# Patient Record
Sex: Male | Born: 1960 | Race: White | Hispanic: No | Marital: Married | Smoking: Current some day smoker
Health system: Southern US, Community
[De-identification: ages and names within clinical notes are randomized; demographics above are authoritative.]

## PROBLEM LIST (undated history)

## (undated) DIAGNOSIS — Z720 Tobacco use: Secondary | ICD-10-CM

## (undated) DIAGNOSIS — E785 Hyperlipidemia, unspecified: Secondary | ICD-10-CM

## (undated) DIAGNOSIS — T7840XA Allergy, unspecified, initial encounter: Secondary | ICD-10-CM

## (undated) DIAGNOSIS — I639 Cerebral infarction, unspecified: Secondary | ICD-10-CM

## (undated) HISTORY — PX: WISDOM TOOTH EXTRACTION: SHX21

## (undated) HISTORY — DX: Allergy, unspecified, initial encounter: T78.40XA

## (undated) HISTORY — DX: Hyperlipidemia, unspecified: E78.5

## (undated) HISTORY — PX: COLONOSCOPY: SHX174

## (undated) HISTORY — PX: POLYPECTOMY: SHX149

## (undated) HISTORY — DX: Tobacco use: Z72.0

## (undated) HISTORY — DX: Cerebral infarction, unspecified: I63.9

---

## 2006-02-10 ENCOUNTER — Emergency Department (HOSPITAL_COMMUNITY): Admission: EM | Admit: 2006-02-10 | Discharge: 2006-02-10 | Payer: Self-pay | Admitting: Emergency Medicine

## 2006-02-16 ENCOUNTER — Ambulatory Visit: Payer: Self-pay | Admitting: Cardiovascular Disease

## 2006-02-19 ENCOUNTER — Ambulatory Visit: Payer: Self-pay | Admitting: Cardiovascular Disease

## 2006-02-19 ENCOUNTER — Ambulatory Visit: Payer: Self-pay | Admitting: Cardiology

## 2010-04-23 ENCOUNTER — Encounter (INDEPENDENT_AMBULATORY_CARE_PROVIDER_SITE_OTHER): Payer: Self-pay | Admitting: *Deleted

## 2010-04-30 NOTE — Letter (Signed)
Summary: Pre Visit Letter Revised  [facility]  [address] [place], [place] [address]   Phone: [phone]  Fax: [phone]        04/23/2010 MRN: [id number] Kyle Lozano [address] [place], [place]  [address]             Procedure Date: 06-04-10           Direct Colon---Dr. Christella Hartigan   Welcome to the Gastroenterology Division at [facility].    You are scheduled to see a nurse for your pre-procedure visit on 05-21-10 at 8:00a.m. on the 3rd floor at [facility], [address]  We ask that you try to arrive at our office 15 minutes prior to your appointment time to allow for check-in.  Please take a minute to review the attached form.  If you answer "Yes" to one or more of the questions on the first page, we ask that you call the person listed at your earliest opportunity.  If you answer "No" to all of the questions, please complete the rest of the form and bring it to your appointment.    Your nurse visit will consist of discussing your medical and surgical history, your immediate family medical history, and your medications.   If you are unable to list all of your medications on the form, please bring the medication bottles to your appointment and we will list them.  We will need to be aware of both prescribed and over the counter drugs.  We will need to know exact dosage information as well.    Please be prepared to read and sign documents such as consent forms, a financial agreement, and acknowledgement forms.  If necessary, and with your consent, a friend or relative is welcome to sit-in on the nurse visit with you.  Please bring your insurance card so that we may make a copy of it.  If your insurance requires a referral to see a specialist, please bring your referral form from your primary care physician.  No co-pay is required for this nurse visit.     If you cannot keep your appointment, please call [phone] to cancel or reschedule prior to your  appointment date.  This allows [place] the opportunity to schedule an appointment for another patient in need of care.    Thank you for choosing [facility] for your medical needs.  We appreciate the opportunity to care for you.  Please visit [place] at our website  to learn more about our practice.  Sincerely, The Gastroenterology Division

## 2010-05-21 ENCOUNTER — Ambulatory Visit (AMBULATORY_SURGERY_CENTER): Payer: 59 | Admitting: *Deleted

## 2010-05-21 VITALS — Ht 71.0 in | Wt 182.0 lb

## 2010-05-21 DIAGNOSIS — Z1211 Encounter for screening for malignant neoplasm of colon: Secondary | ICD-10-CM

## 2010-05-21 MED ORDER — PEG-KCL-NACL-NASULF-NA ASC-C 100 G PO SOLR: 1.0000 | Freq: Once | ORAL | Status: AC

## 2010-06-04 ENCOUNTER — Ambulatory Visit (AMBULATORY_SURGERY_CENTER): Payer: 59 | Admitting: Gastroenterology

## 2010-06-04 ENCOUNTER — Encounter: Payer: Self-pay | Admitting: Gastroenterology

## 2010-06-04 VITALS — BP 115/74 | HR 63 | Temp 97.6°F | Resp 20 | Ht 71.0 in | Wt 185.0 lb

## 2010-06-04 DIAGNOSIS — Z1211 Encounter for screening for malignant neoplasm of colon: Secondary | ICD-10-CM

## 2010-06-04 DIAGNOSIS — D126 Benign neoplasm of colon, unspecified: Secondary | ICD-10-CM

## 2010-06-04 MED ORDER — SODIUM CHLORIDE 0.9 % IV SOLN: 500.0000 mL | INTRAVENOUS | Status: DC

## 2010-06-04 NOTE — Patient Instructions (Signed)
See green and blue sheets for additional d/c instructions.  

## 2010-06-05 ENCOUNTER — Telehealth: Payer: Self-pay | Admitting: *Deleted

## 2010-06-05 NOTE — Telephone Encounter (Signed)
ID on machine left message to call back if having problems or has any questions

## 2015-02-28 ENCOUNTER — Encounter: Payer: Self-pay | Admitting: Physician Assistant

## 2015-02-28 ENCOUNTER — Ambulatory Visit (INDEPENDENT_AMBULATORY_CARE_PROVIDER_SITE_OTHER): Payer: 59 | Admitting: Physician Assistant

## 2015-02-28 VITALS — BP 118/78 | HR 68 | Temp 98.7°F | Resp 18 | Ht 70.25 in | Wt 183.0 lb

## 2015-02-28 DIAGNOSIS — Z0000 Encounter for general adult medical examination without abnormal findings: Secondary | ICD-10-CM | POA: Diagnosis not present

## 2015-02-28 DIAGNOSIS — Z7189 Other specified counseling: Secondary | ICD-10-CM

## 2015-02-28 DIAGNOSIS — Z23 Encounter for immunization: Secondary | ICD-10-CM | POA: Diagnosis not present

## 2015-02-28 DIAGNOSIS — Z7689 Persons encountering health services in other specified circumstances: Secondary | ICD-10-CM

## 2015-02-28 LAB — COMPLETE METABOLIC PANEL WITH GFR
ALBUMIN: 4.4 g/dL (ref 3.6–5.1)
ALT: 30 U/L (ref 9–46)
AST: 21 U/L (ref 10–35)
Alkaline Phosphatase: 57 U/L (ref 40–115)
BUN: 13 mg/dL (ref 7–25)
CALCIUM: 9 mg/dL (ref 8.6–10.3)
CHLORIDE: 104 mmol/L (ref 98–110)
CO2: 21 mmol/L (ref 20–31)
Creat: 1.02 mg/dL (ref 0.70–1.33)
GFR, EST NON AFRICAN AMERICAN: 82 mL/min (ref >=60)
Glucose, Bld: 101 mg/dL — ABNORMAL HIGH (ref 70–99)
Potassium: 4.1 mmol/L (ref 3.5–5.3)
Sodium: 138 mmol/L (ref 135–146)
Total Bilirubin: 0.8 mg/dL (ref 0.2–1.2)
Total Protein: 6.4 g/dL (ref 6.1–8.1)

## 2015-02-28 LAB — LIPID PANEL
CHOL/HDL RATIO: 3.6 ratio (ref <=5.0)
Cholesterol: 208 mg/dL — ABNORMAL HIGH (ref 125–200)
HDL: 58 mg/dL (ref >=40)
LDL CALC: 136 mg/dL — AB (ref <130)
TRIGLYCERIDES: 71 mg/dL (ref <150)
VLDL: 14 mg/dL (ref <30)

## 2015-02-28 LAB — CBC WITH DIFFERENTIAL/PLATELET
BASOS PCT: 0 % (ref 0–1)
Basophils Absolute: 0 10*3/uL (ref 0.0–0.1)
EOS PCT: 2 % (ref 0–5)
Eosinophils Absolute: 0.1 10*3/uL (ref 0.0–0.7)
HEMATOCRIT: 48.6 % (ref 39.0–52.0)
HEMOGLOBIN: 17.1 g/dL — AB (ref 13.0–17.0)
LYMPHS PCT: 23 % (ref 12–46)
Lymphs Abs: 1.5 10*3/uL (ref 0.7–4.0)
MCH: 31.3 pg (ref 26.0–34.0)
MCHC: 35.2 g/dL (ref 30.0–36.0)
MCV: 88.8 fL (ref 78.0–100.0)
MONO ABS: 0.5 10*3/uL (ref 0.1–1.0)
MONOS PCT: 8 % (ref 3–12)
MPV: 9.7 fL (ref 8.6–12.4)
NEUTROS ABS: 4.4 10*3/uL (ref 1.7–7.7)
Neutrophils Relative %: 67 % (ref 43–77)
Platelets: 172 10*3/uL (ref 150–400)
RBC: 5.47 MIL/uL (ref 4.22–5.81)
RDW: 13.8 % (ref 11.5–15.5)
WBC: 6.5 10*3/uL (ref 4.0–10.5)

## 2015-02-28 LAB — TSH: TSH: 1.372 u[IU]/mL (ref 0.350–4.500)

## 2015-02-28 NOTE — Progress Notes (Signed)
Patient ID: Kyle Lozano MRN: [id number], DOB: 03-21-1960 55 y.o. Date of Encounter: 02/28/2015, 9:54 AM    Chief Complaint: Physical (CPE)  HPI: 55 y.o. y/o white male here for CPE.   He is also being seen as a new pt to establish care.  Says it has been 2-3 years "since he went to a doctor, and at his age, thought he should have check-up."  No complaints or concerns.    Review of Systems: Consitutional: No fever, chills, fatigue, night sweats, lymphadenopathy, or weight changes. Eyes: No visual changes, eye redness, or discharge. ENT/Mouth: Ears: No otalgia, tinnitus, hearing loss, discharge. Nose: No congestion, rhinorrhea, sinus pain, or epistaxis. Throat: No sore throat, post nasal drip, or teeth pain. Cardiovascular: No CP, palpitations, diaphoresis, DOE, edema, orthopnea, PND. Respiratory: No cough, hemoptysis, SOB, or wheezing. Gastrointestinal: No anorexia, dysphagia, reflux, pain, nausea, vomiting, hematemesis, diarrhea, constipation, BRBPR, or melena. Genitourinary: No dysuria, frequency, urgency, hematuria, incontinence, nocturia, decreased urinary stream, discharge, impotence, or testicular pain/masses. Musculoskeletal: No decreased ROM, myalgias, stiffness, joint swelling, or weakness. Skin: No rash, erythema, lesion changes, pain, warmth, jaundice, or pruritis. Neurological: No headache, dizziness, syncope, seizures, tremors, memory loss, coordination problems, or paresthesias. Psychological: No anxiety, depression, hallucinations, SI/HI. Endocrine: No fatigue, polydipsia, polyphagia, polyuria, or known diabetes. All other systems were reviewed and are otherwise negative.  Past Medical History  Diagnosis Date  . Allergy     seasonal w/ migraines     History reviewed. No pertinent past surgical history.  Home Meds:  Outpatient Prescriptions Prior to Visit  Medication Sig Dispense Refill  . aspirin 81 MG tablet Take 81 mg by mouth daily.      . Multiple  Vitamins-Minerals (MULTIVITAMIN WITH MINERALS) tablet Take 1 tablet by mouth daily.      Marland Kitchen 0.9 %  sodium chloride infusion      No facility-administered medications prior to visit.    Allergies: No Known Allergies  Social History   Social History  . Marital Status: Married    Spouse Name: N/A  . Number of Children: N/A  . Years of Education: N/A   Occupational History  . Not on file.   Social History Main Topics  . Smoking status: Current Some Day Smoker    Types: Cigarettes  . Smokeless tobacco: Never Used  . Alcohol Use: 10.8 oz/week    18 Cans of beer per week  . Drug Use: No  . Sexual Activity: Not on file   Other Topics Concern  . Not on file   Social History Narrative   He works doing Occupational psychologist   Says his job involves a lot of moving around/ physical activity      Says he only smokes when he drinks on the weekends.   Only drinks on the weekend. Only drinks beer. No other liquor or wine etc    Family History  Problem Relation Age of Onset  . Heart disease Father 74    MI @ 59. No other hx    Physical Exam: Blood pressure 118/78, pulse 68, temperature 98.7 F (37.1 C), temperature source Oral, resp. rate 18, height 5' 10.25" (1.784 m), weight 183 lb (83.008 kg).  General: Well developed, well nourished, WM. Appears in no acute distress. HEENT: Normocephalic, atraumatic. Conjunctiva pink, sclera non-icteric. Pupils 2 mm constricting to 1 mm, round, regular, and equally reactive to light and accomodation. EOMI. Internal auditory canal clear. TMs with good cone of light and without pathology.  Nasal mucosa pink. Nares are without discharge. No sinus tenderness. Oral mucosa pink. Pharynx without exudate.   Neck: Supple. Trachea midline. No thyromegaly. Full ROM. No lymphadenopathy. No carotid bruits.  Lungs: Clear to auscultation bilaterally without wheezes, rales, or rhonchi. Breathing is of normal effort and unlabored. Cardiovascular: RRR with S1 S2.  No murmurs, rubs, or gallops. Distal pulses 2+ symmetrically. No carotid or abdominal bruits. Abdomen: Soft, non-tender, non-distended with normoactive bowel sounds. No hepatosplenomegaly or masses. No rebound/guarding. No CVA tenderness. No hernias. Rectal: No external hemorrhoids or fissures. Rectal vault without masses. Prostate gland firm and smooth. No nodularity, tenderness, mass, or induration.  Musculoskeletal: Full range of motion and 5/5 strength throughout. Skin: Warm and moist without erythema, ecchymosis, wounds, or rash. Neuro: A+Ox3. CN II-XII grossly intact. Moves all extremities spontaneously. Full sensation throughout. Normal gait. DTR 2+ throughout upper and lower extremities. Psych:  Responds to questions appropriately with a normal affect.   Assessment/Plan:  55 y.o. y/o  white male here for CPE  -1. Encounter to establish care with new doctor   2. Visit for preventive health examination  A. Screening Labs: - CBC with Differential/Platelet - COMPLETE METABOLIC PANEL WITH GFR - Lipid panel - TSH - VITAMIN D 25 Hydroxy (Vit-D Deficiency, Fractures)  B. Screening For Prostate Cancer: - PSA  C. Screening For Colorectal Cancer: He had Colonoscopy 05/2010. Polyps. Repeat 5 years. Pt aware due to f/u 4 2017--pt aware --he will schedule f/u.    D. Immunizations: Flu---------------------------------Give now-----------------------------Given Here--02/28/2015 Tetanus-----none in >10 years--Agreeable to receive now---Given here 02/28/2015 Pneumococcal------------------------------------------------------------No indication to get this until age 7 Zostavax-------------------------------------------------------------------Not indicated until age 29   Need for prophylactic vaccination and inoculation against influenza - Flu Vaccine QUAD 36+ mos IM  Need for Tdap vaccination - Tdap vaccine greater than or equal to 7yo IM   Signed:   [address] [place], [place]    02/28/2015 9:54 AM

## 2015-03-01 LAB — VITAMIN D 25 HYDROXY (VIT D DEFICIENCY, FRACTURES): Vit D, 25-Hydroxy: 23 ng/mL — ABNORMAL LOW (ref 30–100)

## 2015-03-01 LAB — PSA: PSA: 1.63 ng/mL (ref <=4.00)

## 2015-03-08 ENCOUNTER — Other Ambulatory Visit: Payer: Self-pay | Admitting: *Deleted

## 2015-03-08 ENCOUNTER — Encounter: Payer: Self-pay | Admitting: *Deleted

## 2015-03-08 DIAGNOSIS — E559 Vitamin D deficiency, unspecified: Secondary | ICD-10-CM

## 2015-03-08 MED ORDER — UNABLE TO FIND: Status: DC

## 2015-05-10 ENCOUNTER — Encounter: Payer: Self-pay | Admitting: Gastroenterology

## 2015-07-19 ENCOUNTER — Encounter: Payer: Self-pay | Admitting: Gastroenterology

## 2015-09-14 ENCOUNTER — Ambulatory Visit (AMBULATORY_SURGERY_CENTER): Payer: Self-pay | Admitting: *Deleted

## 2015-09-14 VITALS — Ht 71.0 in | Wt 180.0 lb

## 2015-09-14 DIAGNOSIS — Z86010 Personal history of colonic polyps: Secondary | ICD-10-CM

## 2015-09-14 MED ORDER — NA SULFATE-K SULFATE-MG SULF 17.5-3.13-1.6 GM/177ML PO SOLN: 1.0000 | Freq: Once | ORAL | 0 refills | Status: AC

## 2015-09-14 NOTE — Progress Notes (Signed)
No egg or soy allergy known to patient  No issues with past sedation with any surgeries  or procedures, no intubation problems  No diet pills per patient No home 02 use per patient  No blood thinners per patient  Pt denies issues with constipation   

## 2015-09-17 ENCOUNTER — Encounter: Payer: Self-pay | Admitting: Gastroenterology

## 2015-09-28 ENCOUNTER — Ambulatory Visit (AMBULATORY_SURGERY_CENTER): Payer: 59 | Admitting: Gastroenterology

## 2015-09-28 ENCOUNTER — Encounter: Payer: Self-pay | Admitting: Gastroenterology

## 2015-09-28 VITALS — BP 109/73 | HR 63 | Temp 98.0°F | Resp 10 | Ht 71.0 in | Wt 180.0 lb

## 2015-09-28 DIAGNOSIS — D120 Benign neoplasm of cecum: Secondary | ICD-10-CM | POA: Diagnosis not present

## 2015-09-28 DIAGNOSIS — Z86010 Personal history of colonic polyps: Secondary | ICD-10-CM

## 2015-09-28 MED ORDER — SODIUM CHLORIDE 0.9 % IV SOLN: 500.0000 mL | INTRAVENOUS | Status: DC

## 2015-09-28 NOTE — Op Note (Signed)
[facility] Patient Name: Kyle Lozano Procedure Date: 09/28/2015 8:33 AM MRN: [id number] Endoscopist: Milus Banister , MD Age: 55 Referring MD:  Date of Birth: 17-Mar-1960 Gender: Male Account #: [id number] Procedure:                Colonoscopy Indications:              High risk colon cancer surveillance: Personal                            history of colonic polyps. colonoscopy Dr. Ardis Hughs                            2012 found 2 subCM adenomas. Medicines:                Monitored Anesthesia Care Procedure:                Pre-Anesthesia Assessment:                           - Prior to the procedure, a History and Physical                            was performed, and patient medications and                            allergies were reviewed. The patient's tolerance of                            previous anesthesia was also reviewed. The risks                            and benefits of the procedure and the sedation                            options and risks were discussed with the patient.                            All questions were answered, and informed consent                            was obtained. Prior Anticoagulants: The patient has                            taken no previous anticoagulant or antiplatelet                            agents. ASA Grade Assessment: II - A patient with                            mild systemic disease. After reviewing the risks                            and benefits, the patient was deemed in  satisfactory condition to undergo the procedure.                           After obtaining informed consent, the colonoscope                            was passed under direct vision. Throughout the                            procedure, the patient's blood pressure, pulse, and                            oxygen saturations were monitored continuously. The                            Model CF-HQ190L [id number]) scope  was introduced                            through the anus and advanced to the the cecum,                            identified by appendiceal orifice and ileocecal                            valve. The colonoscopy was performed without                            difficulty. The patient tolerated the procedure                            well. The quality of the bowel preparation was                            excellent. The ileocecal valve, appendiceal                            orifice, and rectum were photographed. Scope In: 8:41:55 AM Scope Out: 8:53:23 AM Scope Withdrawal Time: 0 hours 9 minutes 12 seconds  Total Procedure Duration: 0 hours 11 minutes 28 seconds  Findings:                 Two sessile polyps were found in the cecum. The                            polyps were 2 to 5 mm in size. These polyps were                            removed with a cold snare. Resection and retrieval                            were complete.                           The exam was otherwise without abnormality on  direct and retroflexion views. Complications:            No immediate complications. Estimated blood loss:                            None. Estimated Blood Loss:     Estimated blood loss: none. Impression:               - Two 2 to 5 mm polyps in the cecum, removed with a                            cold snare. Resected and retrieved.                           - The examination was otherwise normal on direct                            and retroflexion views. Recommendation:           - Patient has a contact number available for                            emergencies. The signs and symptoms of potential                            delayed complications were discussed with the                            patient. Return to normal activities tomorrow.                            Written discharge instructions were provided to the                            patient.                            - Resume previous diet.                           - Continue present medications.                           You will receive a letter within 2-3 weeks with the                            pathology results and my final recommendations.                           If the polyp(s) is proven to be 'pre-cancerous' on                            pathology, you will need repeat colonoscopy in 5                            years. If the polyp(s) is NOT 'precancerous'  on                            pathology then you should repeat colon cancer                            screening in 10 years with colonoscopy without need                            for colon cancer screening by any method prior to                            then (including stool testing). Milus Banister, MD 09/28/2015 8:56:39 AM This report has been signed electronically.

## 2015-09-28 NOTE — Progress Notes (Signed)
To recovery, report to [place], RN, VSS,

## 2015-09-28 NOTE — Patient Instructions (Signed)
YOU HAD AN ENDOSCOPIC PROCEDURE TODAY AT [facility]:   Refer to the procedure report that was given to you for any specific questions about what was found during the examination.  If the procedure report does not answer your questions, please call your gastroenterologist to clarify.  If you requested that your care partner not be given the details of your procedure findings, then the procedure report has been included in a sealed envelope for you to review at your convenience later.  YOU SHOULD EXPECT: Some feelings of bloating in the abdomen. Passage of more gas than usual.  Walking can help get rid of the air that was put into your GI tract during the procedure and reduce the bloating. If you had a lower endoscopy (such as a colonoscopy or flexible sigmoidoscopy) you may notice spotting of blood in your stool or on the toilet paper. If you underwent a bowel prep for your procedure, you may not have a normal bowel movement for a few days.  Please Note:  You might notice some irritation and congestion in your nose or some drainage.  This is from the oxygen used during your procedure.  There is no need for concern and it should clear up in a day or so.  SYMPTOMS TO REPORT IMMEDIATELY:   Following lower endoscopy (colonoscopy or flexible sigmoidoscopy):  Excessive amounts of blood in the stool  Significant tenderness or worsening of abdominal pains  Swelling of the abdomen that is new, acute  Fever of 100F or higher    For urgent or emergent issues, a gastroenterologist can be reached at any hour by calling [phone].   DIET: Your first meal following the procedure should be a small meal and then it is ok to progress to your normal diet. Heavy or fried foods are harder to digest and may make you feel nauseous or bloated.  Likewise, meals heavy in dairy and vegetables can increase bloating.  Drink plenty of fluids but you should avoid alcoholic beverages for 24  hours.  ACTIVITY:  You should plan to take it easy for the rest of today and you should NOT DRIVE or use heavy machinery until tomorrow (because of the sedation medicines used during the test).    FOLLOW UP: Our staff will call the number listed on your records the next business day following your procedure to check on you and address any questions or concerns that you may have regarding the information given to you following your procedure. If we do not reach you, we will leave a message.  However, if you are feeling well and you are not experiencing any problems, there is no need to return our call.  We will assume that you have returned to your regular daily activities without incident.  If any biopsies were taken you will be contacted by phone or by letter within the next 1-3 weeks.  Please call us at [phone] if you have not heard about the biopsies in 3 weeks.    SIGNATURES/CONFIDENTIALITY: You and/or your care partner have signed paperwork which will be entered into your electronic medical record.  These signatures attest to the fact that that the information above on your After Visit Summary has been reviewed and is understood.  Full responsibility of the confidentiality of this discharge information lies with you and/or your care-partner.   INFORMATION ON POLYPS GIVEN TO YOU TODAY  AWAIT PATHOLOGY RESULTS

## 2015-09-28 NOTE — Progress Notes (Signed)
Called to room to assist during endoscopic procedure.  Patient ID and intended procedure confirmed with present staff. Received instructions for my participation in the procedure from the performing physician.  

## 2015-10-01 ENCOUNTER — Telehealth: Payer: Self-pay

## 2015-10-01 NOTE — Telephone Encounter (Signed)
  Follow up Call-  Call back number 09/28/2015  Post procedure Call Back phone  # [phone]  Permission to leave phone message Yes  Some recent data might be hidden     Patient questions:  Do you have a fever, pain , or abdominal swelling? No. Pain Score  0 *  Have you tolerated food without any problems? Yes.    Have you been able to return to your normal activities? Yes.    Do you have any questions about your discharge instructions: Diet   No. Medications  No. Follow up visit  No.  Do you have questions or concerns about your Care? No.  Actions: * If pain score is 4 or above: No action needed, pain <4.

## 2015-10-05 ENCOUNTER — Encounter: Payer: Self-pay | Admitting: Gastroenterology

## 2016-04-25 DIAGNOSIS — L308 Other specified dermatitis: Secondary | ICD-10-CM | POA: Diagnosis not present

## 2016-05-16 DIAGNOSIS — T1500XA Foreign body in cornea, unspecified eye, initial encounter: Secondary | ICD-10-CM | POA: Diagnosis not present

## 2016-05-22 ENCOUNTER — Encounter: Payer: Self-pay | Admitting: Physician Assistant

## 2016-05-22 ENCOUNTER — Ambulatory Visit (INDEPENDENT_AMBULATORY_CARE_PROVIDER_SITE_OTHER): Payer: 59 | Admitting: Physician Assistant

## 2016-05-22 VITALS — BP 108/72 | HR 71 | Temp 98.2°F | Resp 18 | Wt 181.2 lb

## 2016-05-22 DIAGNOSIS — Z0000 Encounter for general adult medical examination without abnormal findings: Secondary | ICD-10-CM

## 2016-05-22 DIAGNOSIS — E559 Vitamin D deficiency, unspecified: Secondary | ICD-10-CM

## 2016-05-22 LAB — CBC WITH DIFFERENTIAL/PLATELET
BASOS ABS: 0 {cells}/uL (ref 0–200)
Basophils Relative: 0 %
EOS PCT: 2 %
Eosinophils Absolute: 98 cells/uL (ref 15–500)
HCT: 49.7 % (ref 38.5–50.0)
Hemoglobin: 16.6 g/dL (ref 13.0–17.0)
LYMPHS PCT: 30 %
Lymphs Abs: 1470 cells/uL (ref 850–3900)
MCH: 30.9 pg (ref 27.0–33.0)
MCHC: 33.4 g/dL (ref 32.0–36.0)
MCV: 92.6 fL (ref 80.0–100.0)
MPV: 9.6 fL (ref 7.5–12.5)
Monocytes Absolute: 441 cells/uL (ref 200–950)
Monocytes Relative: 9 %
NEUTROS PCT: 59 %
Neutro Abs: 2891 cells/uL (ref 1500–7800)
Platelets: 172 10*3/uL (ref 140–400)
RBC: 5.37 MIL/uL (ref 4.20–5.80)
RDW: 13.9 % (ref 11.0–15.0)
WBC: 4.9 10*3/uL (ref 3.8–10.8)

## 2016-05-22 LAB — LIPID PANEL
CHOL/HDL RATIO: 3.2 ratio (ref <5.0)
CHOLESTEROL: 203 mg/dL — AB (ref <200)
HDL: 63 mg/dL (ref >40)
LDL Cholesterol: 130 mg/dL — ABNORMAL HIGH (ref <100)
Triglycerides: 51 mg/dL (ref <150)
VLDL: 10 mg/dL (ref <30)

## 2016-05-22 LAB — COMPLETE METABOLIC PANEL WITH GFR
ALT: 34 U/L (ref 9–46)
AST: 24 U/L (ref 10–35)
Albumin: 4.5 g/dL (ref 3.6–5.1)
Alkaline Phosphatase: 68 U/L (ref 40–115)
BUN: 14 mg/dL (ref 7–25)
CALCIUM: 9 mg/dL (ref 8.6–10.3)
CHLORIDE: 108 mmol/L (ref 98–110)
CO2: 21 mmol/L (ref 20–31)
Creat: 1.03 mg/dL (ref 0.70–1.33)
GFR, EST NON AFRICAN AMERICAN: 81 mL/min (ref >=60)
GFR, Est African American: >89 mL/min (ref >=60)
GLUCOSE: 105 mg/dL — AB (ref 70–99)
POTASSIUM: 4.1 mmol/L (ref 3.5–5.3)
SODIUM: 142 mmol/L (ref 135–146)
Total Bilirubin: 0.7 mg/dL (ref 0.2–1.2)
Total Protein: 6.6 g/dL (ref 6.1–8.1)

## 2016-05-22 LAB — TSH: TSH: 0.92 mIU/L (ref 0.40–4.50)

## 2016-05-22 LAB — PSA: PSA: 1.6 ng/mL (ref <=4.0)

## 2016-05-22 NOTE — Progress Notes (Signed)
Patient ID: Kyle Lozano MRN: [id number], DOB: 1960/08/27 56 y.o. Date of Encounter: 05/22/2016, 8:58 AM    Chief Complaint: Physical (CPE)  HPI: 56 y.o. y/o white male here for CPE.   02/28/2015: He is also being seen as a new pt to establish care.  Says it has been 2-3 years "since he went to a doctor, and at his age, thought he should have check-up." No complaints or concerns.    05/22/2016: Presetns for annual physical. Has no complaints or concerns to address. He has been feeling well. Has had no medical problems over the past year and no updates today.  He does exercise routinely using treadmill at home but is getting ready to join a gym.  Review of Systems: Consitutional: No fever, chills, fatigue, night sweats, lymphadenopathy, or weight changes. Eyes: No visual changes, eye redness, or discharge. ENT/Mouth: Ears: No otalgia, tinnitus, hearing loss, discharge. Nose: No congestion, rhinorrhea, sinus pain, or epistaxis. Throat: No sore throat, post nasal drip, or teeth pain. Cardiovascular: No CP, palpitations, diaphoresis, DOE, edema, orthopnea, PND. Respiratory: No cough, hemoptysis, SOB, or wheezing. Gastrointestinal: No anorexia, dysphagia, reflux, pain, nausea, vomiting, hematemesis, diarrhea, constipation, BRBPR, or melena. Genitourinary: No dysuria, frequency, urgency, hematuria, incontinence, nocturia, decreased urinary stream, discharge, impotence, or testicular pain/masses. Musculoskeletal: No decreased ROM, myalgias, stiffness, joint swelling, or weakness. Skin: No rash, erythema, lesion changes, pain, warmth, jaundice, or pruritis. Neurological: No headache, dizziness, syncope, seizures, tremors, memory loss, coordination problems, or paresthesias. Psychological: No anxiety, depression, hallucinations, SI/HI. Endocrine: No fatigue, polydipsia, polyphagia, polyuria, or known diabetes. All other systems were reviewed and are otherwise negative.  Past Medical  History:  Diagnosis Date  . Allergy    seasonal w/ migraines     Past Surgical History:  Procedure Laterality Date  . COLONOSCOPY    . POLYPECTOMY    . WISDOM TOOTH EXTRACTION      Home Meds:  Outpatient Medications Prior to Visit  Medication Sig Dispense Refill  . aspirin 81 MG tablet Take 81 mg by mouth daily.      . Omega-3 Fatty Acids (FISH OIL) 1000 MG CAPS Take 1 capsule by mouth daily.    Marland Kitchen UNABLE TO FIND Med Name: VITAMIN D OTC 400 Units 0   Facility-Administered Medications Prior to Visit  Medication Dose Route Frequency Provider Last Rate Last Dose  . 0.9 %  sodium chloride infusion  500 mL Intravenous Continuous Milus Banister, MD        Allergies: No Known Allergies  Social History   Social History  . Marital status: Married    Spouse name: N/A  . Number of children: N/A  . Years of education: N/A   Occupational History  . Not on file.   Social History Main Topics  . Smoking status: Current Some Day Smoker    Packs/day: 0.25    Types: Cigarettes  . Smokeless tobacco: Never Used  . Alcohol use 10.8 oz/week    18 Cans of beer per week  . Drug use: No  . Sexual activity: Not on file   Other Topics Concern  . Not on file   Social History Narrative   He works doing Occupational psychologist   Says his job involves a lot of moving around/ physical activity      Says he only smokes when he drinks on the weekends.   Only drinks on the weekend. Only drinks beer. No other liquor or wine etc  Family History  Problem Relation Age of Onset  . Heart disease Father 80    MI @ 11. No other hx  . Colon cancer Neg Hx   . Colon polyps Neg Hx   . Esophageal cancer Neg Hx   . Rectal cancer Neg Hx   . Stomach cancer Neg Hx     Physical Exam: Blood pressure 108/72, pulse 71, temperature 98.2 F (36.8 C), temperature source Oral, resp. rate 18, weight 181 lb 3.2 oz (82.2 kg), SpO2 98 %.  General: Well developed, well nourished, WM. Appears in no acute  distress. HEENT: Normocephalic, atraumatic. Conjunctiva pink, sclera non-icteric. Pupils 2 mm constricting to 1 mm, round, regular, and equally reactive to light and accomodation. EOMI. Internal auditory canal clear. TMs with good cone of light and without pathology. Nasal mucosa pink. Nares are without discharge. No sinus tenderness. Oral mucosa pink. Pharynx without exudate.   Neck: Supple. Trachea midline. No thyromegaly. Full ROM. No lymphadenopathy. No carotid bruits.  Lungs: Clear to auscultation bilaterally without wheezes, rales, or rhonchi. Breathing is of normal effort and unlabored. Cardiovascular: RRR with S1 S2. No murmurs, rubs, or gallops. Distal pulses 2+ symmetrically. No carotid or abdominal bruits. Abdomen: Soft, non-tender, non-distended with normoactive bowel sounds. No hepatosplenomegaly or masses. No rebound/guarding. No CVA tenderness. No hernias. Rectal: No external hemorrhoids or fissures. Rectal vault without masses. Prostate gland firm and smooth. No nodularity, tenderness, mass, or induration.  Musculoskeletal: Full range of motion and 5/5 strength throughout. Skin: Warm and moist without erythema, ecchymosis, wounds, or rash. Neuro: A+Ox3. CN II-XII grossly intact. Moves all extremities spontaneously. Full sensation throughout. Normal gait. DTR 2+ throughout upper and lower extremities. Psych:  Responds to questions appropriately with a normal affect.   Assessment/Plan:  56 y.o. y/o  white male here for CPE  Visit for preventive health examination  A. Screening Labs: 05/22/2016--He is fasting--Check the following labs now - CBC with Differential/Platelet - COMPLETE METABOLIC PANEL WITH GFR - Lipid panel - TSH - VITAMIN D 25 Hydroxy (Vit-D Deficiency, Fractures)  B. Screening For Prostate Cancer: 05/22/2016: - PSA  C. Screening For Colorectal Cancer: 05/22/2016: He had Colonoscopy 05/2010. Polyps. Repeat 5 years. 05/22/2016:He had f/u Colonoscopy 09/28/2015---in  [facility]--Dr. Ardis Hughs   D. Immunizations: Flu--------------------------------------------------------------------------Given Here--02/28/2015--------N/A today----05/22/2016 Tetanus--------------------------------------------------------------------Given here 02/28/2015--------UpToDate----05/22/2016 Pneumococcal------------------------------------------------------------No indication to get this until age 43-------05/22/2016 Zostavax-------------------------------------------------------------------Not indicated until age 32-----We are going to start getting Shingrix but do not have this yet---will discuss at next OV---05/22/2016   Signed:   [address] [place], [place]  05/22/2016 8:58 AM

## 2016-05-23 LAB — VITAMIN D 25 HYDROXY (VIT D DEFICIENCY, FRACTURES): Vit D, 25-Hydroxy: 47 ng/mL (ref 30–100)

## 2017-01-30 DIAGNOSIS — Z23 Encounter for immunization: Secondary | ICD-10-CM | POA: Diagnosis not present

## 2017-01-30 DIAGNOSIS — L308 Other specified dermatitis: Secondary | ICD-10-CM | POA: Diagnosis not present

## 2017-05-28 ENCOUNTER — Other Ambulatory Visit: Payer: Self-pay

## 2017-05-28 ENCOUNTER — Encounter: Payer: Self-pay | Admitting: Physician Assistant

## 2017-05-28 ENCOUNTER — Ambulatory Visit (INDEPENDENT_AMBULATORY_CARE_PROVIDER_SITE_OTHER): Payer: 59 | Admitting: Physician Assistant

## 2017-05-28 VITALS — BP 126/74 | HR 93 | Temp 98.1°F | Resp 14 | Ht 70.5 in | Wt 175.4 lb

## 2017-05-28 DIAGNOSIS — E559 Vitamin D deficiency, unspecified: Secondary | ICD-10-CM

## 2017-05-28 DIAGNOSIS — N529 Male erectile dysfunction, unspecified: Secondary | ICD-10-CM | POA: Diagnosis not present

## 2017-05-28 DIAGNOSIS — Z0000 Encounter for general adult medical examination without abnormal findings: Secondary | ICD-10-CM | POA: Diagnosis not present

## 2017-05-28 DIAGNOSIS — R6882 Decreased libido: Secondary | ICD-10-CM | POA: Diagnosis not present

## 2017-05-28 MED ORDER — SILDENAFIL CITRATE 100 MG PO TABS: 50.0000 mg | ORAL_TABLET | Freq: Every day | ORAL | 11 refills | Status: DC | PRN
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
## 2017-05-29 ENCOUNTER — Other Ambulatory Visit: Payer: 59

## 2017-06-03 ENCOUNTER — Other Ambulatory Visit: Payer: 59

## 2017-06-03 DIAGNOSIS — E559 Vitamin D deficiency, unspecified: Secondary | ICD-10-CM

## 2017-06-03 DIAGNOSIS — Z0000 Encounter for general adult medical examination without abnormal findings: Secondary | ICD-10-CM

## 2017-06-03 DIAGNOSIS — N529 Male erectile dysfunction, unspecified: Secondary | ICD-10-CM

## 2017-06-03 DIAGNOSIS — R6882 Decreased libido: Secondary | ICD-10-CM

## 2017-06-03 DIAGNOSIS — R739 Hyperglycemia, unspecified: Secondary | ICD-10-CM | POA: Diagnosis not present

## 2017-06-09 LAB — CBC WITH DIFFERENTIAL/PLATELET
BASOS PCT: 0.7 %
Basophils Absolute: 43 cells/uL (ref 0–200)
EOS ABS: 124 {cells}/uL (ref 15–500)
Eosinophils Relative: 2 %
HCT: 49.2 % (ref 38.5–50.0)
Hemoglobin: 17.3 g/dL — ABNORMAL HIGH (ref 13.2–17.1)
LYMPHS ABS: 2207 {cells}/uL (ref 850–3900)
MCH: 31.6 pg (ref 27.0–33.0)
MCHC: 35.2 g/dL (ref 32.0–36.0)
MCV: 89.9 fL (ref 80.0–100.0)
MPV: 9.9 fL (ref 7.5–12.5)
Monocytes Relative: 9.3 %
NEUTROS PCT: 52.4 %
Neutro Abs: 3249 cells/uL (ref 1500–7800)
Platelets: 200 10*3/uL (ref 140–400)
RBC: 5.47 10*6/uL (ref 4.20–5.80)
RDW: 12.8 % (ref 11.0–15.0)
TOTAL LYMPHOCYTE: 35.6 %
WBC: 6.2 10*3/uL (ref 3.8–10.8)
WBCMIX: 577 {cells}/uL (ref 200–950)

## 2017-06-09 LAB — COMPLETE METABOLIC PANEL WITH GFR
AG Ratio: 2.4 (calc) (ref 1.0–2.5)
ALBUMIN MSPROF: 4.5 g/dL (ref 3.6–5.1)
ALT: 22 U/L (ref 9–46)
AST: 21 U/L (ref 10–35)
Alkaline phosphatase (APISO): 77 U/L (ref 40–115)
BUN: 14 mg/dL (ref 7–25)
CALCIUM: 9 mg/dL (ref 8.6–10.3)
CHLORIDE: 107 mmol/L (ref 98–110)
CO2: 24 mmol/L (ref 20–32)
CREATININE: 1.05 mg/dL (ref 0.70–1.33)
GFR, EST AFRICAN AMERICAN: 91 mL/min/{1.73_m2} (ref >=60)
GFR, EST NON AFRICAN AMERICAN: 78 mL/min/{1.73_m2} (ref >=60)
GLUCOSE: 133 mg/dL — AB (ref 65–99)
Globulin: 1.9 g/dL (calc) (ref 1.9–3.7)
Potassium: 4.2 mmol/L (ref 3.5–5.3)
Sodium: 142 mmol/L (ref 135–146)
TOTAL PROTEIN: 6.4 g/dL (ref 6.1–8.1)
Total Bilirubin: 0.7 mg/dL (ref 0.2–1.2)

## 2017-06-09 LAB — TESTOSTERONE: Testosterone: 439 ng/dL (ref 250–827)

## 2017-06-09 LAB — LIPID PANEL
Cholesterol: 196 mg/dL (ref <200)
HDL: 74 mg/dL (ref >40)
LDL Cholesterol (Calc): 108 mg/dL — ABNORMAL HIGH
Non-HDL Cholesterol (Calc): 122 mg/dL (ref <130)
Total CHOL/HDL Ratio: 2.6 (calc) (ref <5.0)
Triglycerides: 49 mg/dL (ref <150)

## 2017-06-09 LAB — HEMOGLOBIN A1C: Hgb A1c MFr Bld: 5.6 %{Hb} (ref <5.7)

## 2017-06-09 LAB — TSH: TSH: 1.31 mIU/L (ref 0.40–4.50)

## 2017-06-09 LAB — PSA: PSA: 2.2 ng/mL (ref <=4.0)

## 2017-06-09 LAB — VITAMIN D 25 HYDROXY (VIT D DEFICIENCY, FRACTURES): Vit D, 25-Hydroxy: 30 ng/mL (ref 30–100)

## 2017-10-29 ENCOUNTER — Ambulatory Visit: Payer: 59 | Admitting: Physician Assistant

## 2017-10-29 ENCOUNTER — Encounter: Payer: Self-pay | Admitting: Physician Assistant

## 2017-10-29 VITALS — BP 108/64 | HR 76 | Temp 98.9°F | Resp 16 | Ht 71.0 in | Wt 176.6 lb

## 2017-10-29 DIAGNOSIS — S76312A Strain of muscle, fascia and tendon of the posterior muscle group at thigh level, left thigh, initial encounter: Secondary | ICD-10-CM

## 2017-10-29 NOTE — Progress Notes (Signed)
Patient ID: Kyle Lozano MRN: [id number], DOB: 09-22-1960, 57 y.o. Date of Encounter: 10/29/2017, 2:39 PM    Chief Complaint:  Chief Complaint  Patient presents with  . Knee Pain     HPI: 57 y.o. year old male presents with above.   Reports that he has been working a job recently that requires a lot of squatting down, then standing up, then squatting down again.  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  Reports that he has been working this job for about a month. He has had some gradual onset of this discomfort in the posterior aspect of the left leg-- just above the knee.  Says that it actually is a lot better today.  Says that "last week it was killing me."  Says that  "it was at its worst 2 days ago."      Home Meds:   Outpatient Medications Prior to Visit  Medication Sig Dispense Refill  . aspirin 81 MG tablet Take 81 mg by mouth daily.      . clobetasol cream (TEMOVATE) 0.30 % Apply 1 application topically daily as needed.    . Omega-3 Fatty Acids (FISH OIL) 1000 MG CAPS Take 1 capsule by mouth daily.    . sildenafil (VIAGRA) 100 MG tablet Take 0.5-1 tablets (50-100 mg total) by mouth daily as needed for erectile dysfunction. 3 tablet 11  . tobramycin (TOBREX) 0.3 % ophthalmic solution Place 1 drop into the left eye daily.    Marland Kitchen UNABLE TO FIND Med Name: VITAMIN D OTC 400 Units 0   Facility-Administered Medications Prior to Visit  Medication Dose Route Frequency Provider Last Rate Last Dose  . 0.9 %  sodium chloride infusion  500 mL Intravenous Continuous Milus Banister, MD        Allergies: No Known Allergies    Review of Systems: See HPI for pertinent ROS. All other ROS negative.    Physical Exam: Blood pressure 108/64, pulse 76, temperature 98.9 F (37.2 C), temperature source Oral, resp. rate 16, height 5\' 11"  (1.803 m), weight 80.1 kg, SpO2 96 %., Body mass index is 24.63 kg/m. General:  WNWD WF. Appears in no  acute distress. Neck: Supple. No thyromegaly. No lymphadenopathy. Lungs: Clear bilaterally to auscultation without wheezes, rales, or rhonchi. Breathing is unlabored. Heart: Regular rhythm. No murmurs, rubs, or gallops. Msk:  Strength and tone normal for age. Left Knee: Inspection, Physical exam of the knee is normal.  There is tenderness with palpation of posterior aspect of left leg, just superior to level of knee. There is area of tenderness with palpation.  Had him do a hamstring stretch, straight leg raise---this reproduces his area of pain, symptoms.  Extremities/Skin: Warm and dry.  Neuro: Alert and oriented X 3. Moves all extremities spontaneously. Gait is normal. CNII-XII grossly in tact. Psych:  Responds to questions appropriately with a normal affect.     ASSESSMENT AND PLAN:  57 y.o. year old male with   1. Strain of left hamstring, initial encounter Feel that his symptoms are secondary to hamstring strain.  Discussed resting his muscle.  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  Not require any type of formal work restrictions or work note.  Recommended that he rest these  muscles.  Stop the squatting and standing repetitive motions.  Told him to do this same hamstring stretch at home at least once a day.  If symptoms worsen call and will refer to orthopedics for further evaluation.   [address] [place], [place], [facility] 10/29/2017 2:39 PM

## 2017-11-11 DIAGNOSIS — M2392 Unspecified internal derangement of left knee: Secondary | ICD-10-CM | POA: Diagnosis not present

## 2017-11-11 DIAGNOSIS — M25562 Pain in left knee: Secondary | ICD-10-CM | POA: Insufficient documentation

## 2018-11-02 ENCOUNTER — Other Ambulatory Visit: Payer: Self-pay | Admitting: Registered"

## 2018-11-02 DIAGNOSIS — Z20822 Contact with and (suspected) exposure to covid-19: Secondary | ICD-10-CM

## 2018-11-04 LAB — NOVEL CORONAVIRUS, NAA: SARS-CoV-2, NAA: NOT DETECTED

## 2018-12-09 ENCOUNTER — Other Ambulatory Visit: Payer: Self-pay

## 2018-12-09 ENCOUNTER — Encounter: Payer: Self-pay | Admitting: Family Medicine

## 2018-12-09 ENCOUNTER — Ambulatory Visit (INDEPENDENT_AMBULATORY_CARE_PROVIDER_SITE_OTHER): Payer: 59 | Admitting: Family Medicine

## 2018-12-09 VITALS — BP 110/74 | HR 90 | Temp 97.8°F | Resp 14 | Ht 71.0 in | Wt 174.0 lb

## 2018-12-09 DIAGNOSIS — Z125 Encounter for screening for malignant neoplasm of prostate: Secondary | ICD-10-CM

## 2018-12-09 DIAGNOSIS — Z1159 Encounter for screening for other viral diseases: Secondary | ICD-10-CM | POA: Diagnosis not present

## 2018-12-09 DIAGNOSIS — Z720 Tobacco use: Secondary | ICD-10-CM | POA: Diagnosis not present

## 2018-12-09 DIAGNOSIS — Z0000 Encounter for general adult medical examination without abnormal findings: Secondary | ICD-10-CM

## 2018-12-09 DIAGNOSIS — Z23 Encounter for immunization: Secondary | ICD-10-CM | POA: Diagnosis not present

## 2018-12-09 NOTE — Addendum Note (Signed)
Addended by: Shary Decamp B on: 12/09/2018 11:05 AM   Modules accepted: Orders

## 2018-12-09 NOTE — Progress Notes (Signed)
Subjective:    Patient ID: Kyle Lozano, male    DOB: [birth year], 58 y.o.   MRN: [id number]  HPI Patient is a very pleasant 59 year old Caucasian male here today for complete physical exam.  His last colonoscopy was in 2017.  He is due for repeat colonoscopy in 2022.Marland Kitchen  He is due for prostate cancer screening with a PSA.  Patient smokes however he does not smoke a pack a day.  He does not have 30-pack-year history of smoking.  Therefore he does not qualify for lung cancer screening.  Strongly recommended smoking cessation however the patient is in the precontemplative phase.  However due to his smoking, he is due for Pneumovax 23.  He also request a flu shot.  Given his age, he is also recommended to have hepatitis C screening.  Otherwise his review of systems is negative.  He denies any concerns.  He is tolerating Viagra without any complications. Past Medical History:  Diagnosis Date  . Allergy    seasonal w/ migraines   Past Surgical History:  Procedure Laterality Date  . COLONOSCOPY    . POLYPECTOMY    . WISDOM TOOTH EXTRACTION     Current Outpatient Medications on File Prior to Visit  Medication Sig Dispense Refill  . aspirin 81 MG tablet Take 81 mg by mouth daily.      . clobetasol cream (TEMOVATE) [id number] % Apply 1 application topically daily as needed.    . Omega-3 Fatty Acids (FISH OIL) 1000 MG CAPS Take 1 capsule by mouth daily.    . sildenafil (VIAGRA) 100 MG tablet Take 0.5-1 tablets (50-100 mg total) by mouth daily as needed for erectile dysfunction. 3 tablet 11  . UNABLE TO FIND Med Name: VITAMIN D OTC 400 Units 0   Current Facility-Administered Medications on File Prior to Visit  Medication Dose Route Frequency Provider Last Rate Last Dose  . 0.9 %  sodium chloride infusion  500 mL Intravenous Continuous Milus Banister, MD       No Known Allergies Social History   Socioeconomic History  . Marital status: Married    Spouse name: Not on file  . Number of children:  Not on file  . Years of education: Not on file  . Highest education level: Not on file  Occupational History  . Not on file  Social Needs  . Financial resource strain: Not on file  . Food insecurity    Worry: Not on file    Inability: Not on file  . Transportation needs    Medical: Not on file    Non-medical: Not on file  Tobacco Use  . Smoking status: Current Some Day Smoker    Packs/day: 0.25    Types: Cigarettes  . Smokeless tobacco: Never Used  Substance and Sexual Activity  . Alcohol use: Yes    Alcohol/week: 18.0 standard drinks    Types: 18 Cans of beer per week  . Drug use: No  . Sexual activity: Not on file  Lifestyle  . Physical activity    Days per week: Not on file    Minutes per session: Not on file  . Stress: Not on file  Relationships  . Social Herbalist on phone: Not on file    Gets together: Not on file    Attends religious service: Not on file    Active member of club or organization: Not on file    Attends meetings of clubs or organizations: Not  on file    Relationship status: Not on file  . Intimate partner violence    Fear of current or ex partner: Not on file    Emotionally abused: Not on file    Physically abused: Not on file    Forced sexual activity: Not on file  Other Topics Concern  . Not on file  Social History Narrative   He works doing Occupational psychologist   Says his job involves a lot of moving around/ physical activity      Says he only smokes when he drinks on the weekends.   Only drinks on the weekend. Only drinks beer. No other liquor or wine etc   Family History  Problem Relation Age of Onset  . Heart disease Father 27       MI @ 55. No other hx  . Colon cancer Neg Hx   . Colon polyps Neg Hx   . Esophageal cancer Neg Hx   . Rectal cancer Neg Hx   . Stomach cancer Neg Hx       Review of Systems  All other systems reviewed and are negative.      Objective:   Physical Exam Vitals signs reviewed.   Constitutional:      General: He is not in acute distress.    Appearance: Normal appearance. He is normal weight. He is not ill-appearing, toxic-appearing or diaphoretic.  HENT:     Head: Normocephalic and atraumatic.     Right Ear: Tympanic membrane and ear canal normal. There is no impacted cerumen.     Left Ear: Tympanic membrane and ear canal normal. There is no impacted cerumen.     Nose: Nose normal. No congestion or rhinorrhea.     Mouth/Throat:     Mouth: Mucous membranes are moist.     Pharynx: Oropharynx is clear. No oropharyngeal exudate or posterior oropharyngeal erythema.  Eyes:     General: No scleral icterus.       Right eye: No discharge.        Left eye: No discharge.     Extraocular Movements: Extraocular movements intact.     Conjunctiva/sclera: Conjunctivae normal.     Pupils: Pupils are equal, round, and reactive to light.  Neck:     Musculoskeletal: Neck supple. No neck rigidity or muscular tenderness.     Vascular: No carotid bruit.  Cardiovascular:     Rate and Rhythm: Normal rate and regular rhythm.     Pulses: Normal pulses.     Heart sounds: Normal heart sounds. No murmur. No friction rub. No gallop.   Pulmonary:     Effort: Pulmonary effort is normal. No respiratory distress.     Breath sounds: Normal breath sounds. No stridor. No wheezing, rhonchi or rales.  Chest:     Chest wall: No tenderness.  Abdominal:     General: Abdomen is flat. Bowel sounds are normal. There is no distension.     Palpations: Abdomen is soft. There is no mass.     Tenderness: There is no abdominal tenderness. There is no right CVA tenderness, left CVA tenderness, guarding or rebound.     Hernia: No hernia is present.  Musculoskeletal: Normal range of motion.        General: No swelling, tenderness, deformity or signs of injury.     Right lower leg: No edema.     Left lower leg: No edema.  Lymphadenopathy:     Cervical: No cervical adenopathy.  Skin:  General: Skin is  warm.     Capillary Refill: Capillary refill takes less than 2 seconds.     Coloration: Skin is not jaundiced or pale.     Findings: No bruising, erythema, lesion or rash.  Neurological:     General: No focal deficit present.     Mental Status: He is alert and oriented to person, place, and time. Mental status is at baseline.     Cranial Nerves: No cranial nerve deficit.     Sensory: No sensory deficit.     Motor: No weakness.     Coordination: Coordination normal.     Gait: Gait normal.     Deep Tendon Reflexes: Reflexes normal.           Assessment & Plan:  General medical exam - Plan: CBC with Differential/Platelet, COMPLETE METABOLIC PANEL WITH GFR, Lipid panel  Tobacco abuse - Plan: CBC with Differential/Platelet, COMPLETE METABOLIC PANEL WITH GFR, Lipid panel  Prostate cancer screening - Plan: PSA  Encounter for hepatitis C screening test for low risk patient - Plan: Hepatitis C Antibody  Physical exam today is normal.  Blood pressure is excellent.  Colon cancer screening is up-to-date.  I will have the patient return fasting for lab work and at that time I will check a PSA to screen for prostate cancer.  I will also screen the patient for hepatitis C.  Recommended smoking cessation.  However the patient received his flu shot today and due to his smoking also received Pneumovax 23.  Also return fasting for CBC, CMP, and fasting lipid panel.  I will calculate the patient's 10-year history of cardiovascular disease and if greater than 7.5% would recommend a statin.  Regular anticipatory guidance is provided.

## 2018-12-13 ENCOUNTER — Other Ambulatory Visit: Payer: Self-pay

## 2018-12-13 ENCOUNTER — Other Ambulatory Visit: Payer: 59

## 2018-12-14 ENCOUNTER — Encounter: Payer: Self-pay | Admitting: Family Medicine

## 2018-12-14 DIAGNOSIS — E785 Hyperlipidemia, unspecified: Secondary | ICD-10-CM | POA: Insufficient documentation

## 2018-12-16 ENCOUNTER — Other Ambulatory Visit: Payer: Self-pay

## 2018-12-16 DIAGNOSIS — R6882 Decreased libido: Secondary | ICD-10-CM

## 2018-12-16 DIAGNOSIS — N529 Male erectile dysfunction, unspecified: Secondary | ICD-10-CM

## 2018-12-16 LAB — LIPID PANEL
Cholesterol: 225 mg/dL — ABNORMAL HIGH (ref <200)
HDL: 83 mg/dL (ref >=40)
LDL Cholesterol (Calc): 127 mg/dL (calc) — ABNORMAL HIGH
Non-HDL Cholesterol (Calc): 142 mg/dL (calc) — ABNORMAL HIGH (ref <130)
Total CHOL/HDL Ratio: 2.7 (calc) (ref <5.0)
Triglycerides: 49 mg/dL (ref <150)

## 2018-12-16 LAB — CBC WITH DIFFERENTIAL/PLATELET
Absolute Monocytes: 602 cells/uL (ref 200–950)
Basophils Absolute: 41 cells/uL (ref 0–200)
Basophils Relative: 0.7 %
Eosinophils Absolute: 142 cells/uL (ref 15–500)
Eosinophils Relative: 2.4 %
HCT: 48.8 % (ref 38.5–50.0)
Hemoglobin: 16.6 g/dL (ref 13.2–17.1)
Lymphs Abs: 1947 cells/uL (ref 850–3900)
MCH: 31.8 pg (ref 27.0–33.0)
MCHC: 34 g/dL (ref 32.0–36.0)
MCV: 93.5 fL (ref 80.0–100.0)
MPV: 9.9 fL (ref 7.5–12.5)
Monocytes Relative: 10.2 %
Neutro Abs: 3168 cells/uL (ref 1500–7800)
Neutrophils Relative %: 53.7 %
Platelets: 192 10*3/uL (ref 140–400)
RBC: 5.22 10*6/uL (ref 4.20–5.80)
RDW: 12.8 % (ref 11.0–15.0)
Total Lymphocyte: 33 %
WBC: 5.9 10*3/uL (ref 3.8–10.8)

## 2018-12-16 LAB — COMPLETE METABOLIC PANEL WITH GFR
AG Ratio: 2.4 (calc) (ref 1.0–2.5)
ALT: 31 U/L (ref 9–46)
AST: 22 U/L (ref 10–35)
Albumin: 4.5 g/dL (ref 3.6–5.1)
Alkaline phosphatase (APISO): 71 U/L (ref 35–144)
BUN: 14 mg/dL (ref 7–25)
CO2: 23 mmol/L (ref 20–32)
Calcium: 9.1 mg/dL (ref 8.6–10.3)
Chloride: 107 mmol/L (ref 98–110)
Creat: 1.02 mg/dL (ref 0.70–1.33)
GFR, Est African American: 93 mL/min/{1.73_m2} (ref >=60)
GFR, Est Non African American: 80 mL/min/{1.73_m2} (ref >=60)
Globulin: 1.9 g/dL (calc) (ref 1.9–3.7)
Glucose, Bld: 145 mg/dL — ABNORMAL HIGH (ref 65–99)
Potassium: 4.1 mmol/L (ref 3.5–5.3)
Sodium: 141 mmol/L (ref 135–146)
Total Bilirubin: 0.6 mg/dL (ref 0.2–1.2)
Total Protein: 6.4 g/dL (ref 6.1–8.1)

## 2018-12-16 LAB — HEMOGLOBIN A1C W/OUT EAG: Hgb A1c MFr Bld: 5.4 % of total Hgb (ref <5.7)

## 2018-12-16 LAB — HEPATITIS C ANTIBODY
Hepatitis C Ab: NONREACTIVE
SIGNAL TO CUT-OFF: 0 (ref <1.00)

## 2018-12-16 LAB — TEST AUTHORIZATION

## 2018-12-16 LAB — PSA: PSA: 1.7 ng/mL (ref <=4.0)

## 2018-12-16 MED ORDER — ATORVASTATIN CALCIUM 20 MG PO TABS: 20.0000 mg | ORAL_TABLET | Freq: Every day | ORAL | 0 refills | Status: DC

## 2018-12-16 MED ORDER — SILDENAFIL CITRATE 100 MG PO TABS: 50.0000 mg | ORAL_TABLET | Freq: Every day | ORAL | 11 refills | Status: DC | PRN

## 2019-03-10 ENCOUNTER — Other Ambulatory Visit: Payer: Self-pay | Admitting: Family Medicine

## 2019-12-12 ENCOUNTER — Other Ambulatory Visit: Payer: Self-pay

## 2019-12-12 ENCOUNTER — Ambulatory Visit (INDEPENDENT_AMBULATORY_CARE_PROVIDER_SITE_OTHER): Payer: 59 | Admitting: Family Medicine

## 2019-12-12 VITALS — BP 130/90 | HR 93 | Temp 99.5°F | Ht 71.0 in | Wt 178.0 lb

## 2019-12-12 DIAGNOSIS — Z0001 Encounter for general adult medical examination with abnormal findings: Secondary | ICD-10-CM | POA: Diagnosis not present

## 2019-12-12 DIAGNOSIS — E7800 Pure hypercholesterolemia, unspecified: Secondary | ICD-10-CM

## 2019-12-12 DIAGNOSIS — S30861A Insect bite (nonvenomous) of abdominal wall, initial encounter: Secondary | ICD-10-CM

## 2019-12-12 DIAGNOSIS — Z0000 Encounter for general adult medical examination without abnormal findings: Secondary | ICD-10-CM

## 2019-12-12 DIAGNOSIS — Z23 Encounter for immunization: Secondary | ICD-10-CM

## 2019-12-12 DIAGNOSIS — K760 Fatty (change of) liver, not elsewhere classified: Secondary | ICD-10-CM

## 2019-12-12 DIAGNOSIS — W57XXXA Bitten or stung by nonvenomous insect and other nonvenomous arthropods, initial encounter: Secondary | ICD-10-CM

## 2019-12-12 NOTE — Progress Notes (Signed)
Subjective:    Patient ID: Kyle Lozano, male    DOB: [birth year], 59 y.o.   MRN: [id number]  HPI Patient is a very pleasant 59 year old Caucasian male here today for complete physical exam.  His last colonoscopy was in 2017.  He is due for repeat colonoscopy in 2022.Marland Kitchen  He is due for prostate cancer screening with a PSA.  Patient smokes however he does not smoke a pack a day.  He does not have 30-pack-year history of smoking.  Therefore he does not qualify for lung cancer screening.  Strongly recommended smoking cessation however the patient is in the precontemplative phase.  Patient got his pneumonia shot last year.  He is due for a flu shot today and he would like to receive that.  He is already had both doses of the Covid vaccine.  Because of his smoking I would recommend a booster on that.  It has been more than 6 months since he got the Covid shot and therefore I recommended he contact his local pharmacy to arrange the Covid vaccine.  He is also due for shingles vaccine.  I will get that scheduled at a later date when it is more convenient for him.  He does report a tick bite that he had recently on his abdomen and lower back.  After that he had some numbness and tingling and nerve issues in his left leg which sound more musculoskeletal but he is concerned about possible Lyme disease.  He would like a blood test to screen for Lyme disease Past Medical History:  Diagnosis Date  . Allergy    seasonal w/ migraines  . HLD (hyperlipidemia)    Past Surgical History:  Procedure Laterality Date  . COLONOSCOPY    . POLYPECTOMY    . WISDOM TOOTH EXTRACTION     Current Outpatient Medications on File Prior to Visit  Medication Sig Dispense Refill  . aspirin 81 MG tablet Take 81 mg by mouth daily.      Marland Kitchen atorvastatin (LIPITOR) 20 MG tablet TAKE 1 TABLET BY MOUTH EVERY DAY 90 tablet 0  . Omega-3 Fatty Acids (FISH OIL) 1000 MG CAPS Take 1 capsule by mouth daily.    . sildenafil (VIAGRA) 100 MG  tablet Take 0.5-1 tablets (50-100 mg total) by mouth daily as needed for erectile dysfunction. 3 tablet 11  . clobetasol cream (TEMOVATE) 3.81 % Apply 1 application topically daily as needed. (Patient not taking: Reported on 12/12/2019)    . UNABLE TO FIND Med Name: VITAMIN D OTC (Patient not taking: Reported on 12/12/2019) 400 Units 0   Current Facility-Administered Medications on File Prior to Visit  Medication Dose Route Frequency Provider Last Rate Last Admin  . 0.9 %  sodium chloride infusion  500 mL Intravenous Continuous Milus Banister, MD       No Known Allergies Social History   Socioeconomic History  . Marital status: Married    Spouse name: Not on file  . Number of children: Not on file  . Years of education: Not on file  . Highest education level: Not on file  Occupational History  . Not on file  Tobacco Use  . Smoking status: Current Some Day Smoker    Packs/day: 0.25    Types: Cigarettes  . Smokeless tobacco: Never Used  Substance and Sexual Activity  . Alcohol use: Yes    Alcohol/week: 18.0 standard drinks    Types: 18 Cans of beer per week  . Drug use: No  .  Sexual activity: Not on file  Other Topics Concern  . Not on file  Social History Narrative   He works doing Occupational psychologist   Says his job involves a lot of moving around/ physical activity      Says he only smokes when he drinks on the weekends.   Only drinks on the weekend. Only drinks beer. No other liquor or wine etc   Social Determinants of Health   Financial Resource Strain:   . Difficulty of Paying Living Expenses: Not on file  Food Insecurity:   . Worried About Charity fundraiser in the Last Year: Not on file  . Ran Out of Food in the Last Year: Not on file  Transportation Needs:   . Lack of Transportation (Medical): Not on file  . Lack of Transportation (Non-Medical): Not on file  Physical Activity:   . Days of Exercise per Week: Not on file  . Minutes of Exercise per  Session: Not on file  Stress:   . Feeling of Stress : Not on file  Social Connections:   . Frequency of Communication with Friends and Family: Not on file  . Frequency of Social Gatherings with Friends and Family: Not on file  . Attends Religious Services: Not on file  . Active Member of Clubs or Organizations: Not on file  . Attends Archivist Meetings: Not on file  . Marital Status: Not on file  Intimate Partner Violence:   . Fear of Current or Ex-Partner: Not on file  . Emotionally Abused: Not on file  . Physically Abused: Not on file  . Sexually Abused: Not on file   Family History  Problem Relation Age of Onset  . Heart disease Father 34       MI @ 54. No other hx  . Colon cancer Neg Hx   . Colon polyps Neg Hx   . Esophageal cancer Neg Hx   . Rectal cancer Neg Hx   . Stomach cancer Neg Hx       Review of Systems  All other systems reviewed and are negative.      Objective:   Physical Exam Vitals reviewed.  Constitutional:      General: He is not in acute distress.    Appearance: Normal appearance. He is normal weight. He is not ill-appearing, toxic-appearing or diaphoretic.  HENT:     Head: Normocephalic and atraumatic.     Right Ear: Tympanic membrane and ear canal normal. There is no impacted cerumen.     Left Ear: Tympanic membrane and ear canal normal. There is no impacted cerumen.     Nose: Nose normal. No congestion or rhinorrhea.     Mouth/Throat:     Mouth: Mucous membranes are moist.     Pharynx: Oropharynx is clear. No oropharyngeal exudate or posterior oropharyngeal erythema.  Eyes:     General: No scleral icterus.       Right eye: No discharge.        Left eye: No discharge.     Extraocular Movements: Extraocular movements intact.     Conjunctiva/sclera: Conjunctivae normal.     Pupils: Pupils are equal, round, and reactive to light.  Neck:     Vascular: No carotid bruit.  Cardiovascular:     Rate and Rhythm: Normal rate and regular  rhythm.     Pulses: Normal pulses.     Heart sounds: Normal heart sounds. No murmur heard.  No friction rub. No  gallop.   Pulmonary:     Effort: Pulmonary effort is normal. No respiratory distress.     Breath sounds: Normal breath sounds. No stridor. No wheezing, rhonchi or rales.  Chest:     Chest wall: No tenderness.  Abdominal:     General: Abdomen is flat. Bowel sounds are normal. There is no distension.     Palpations: Abdomen is soft. There is no mass.     Tenderness: There is no abdominal tenderness. There is no right CVA tenderness, left CVA tenderness, guarding or rebound.     Hernia: No hernia is present.  Musculoskeletal:        General: No swelling, tenderness, deformity or signs of injury. Normal range of motion.     Cervical back: Neck supple. No rigidity. No muscular tenderness.     Right lower leg: No edema.     Left lower leg: No edema.  Lymphadenopathy:     Cervical: No cervical adenopathy.  Skin:    General: Skin is warm.     Capillary Refill: Capillary refill takes less than 2 seconds.     Coloration: Skin is not jaundiced or pale.     Findings: No bruising, erythema, lesion or rash.  Neurological:     General: No focal deficit present.     Mental Status: He is alert and oriented to person, place, and time. Mental status is at baseline.     Cranial Nerves: No cranial nerve deficit.     Sensory: No sensory deficit.     Motor: No weakness.     Coordination: Coordination normal.     Gait: Gait normal.     Deep Tendon Reflexes: Reflexes normal.           Assessment & Plan:  Tick bite of abdomen, initial encounter - Plan: B. burgdorfi antibodies by WB  General medical exam - Plan: CBC with Differential/Platelet, COMPLETE METABOLIC PANEL WITH GFR, Lipid panel, PSA  Physical exam today is normal.  Blood pressure is excellent.  Colon cancer screening is up-to-date.  Patient received his flu shot today.  I recommended a booster on the Covid vaccine at his  earliest convenience.  The shingles shot was recommended today however I would like him to schedule that at his convenience may be the spring.  He is due for colonoscopy next year.  I will screen for prostate cancer with a PSA.  Check CBC, CMP, fasting lipid panel.  I will screen the patient for Lyme disease per his request due to the tick bite he recently had and the neurologic symptoms in his left leg however I suspect lumbar radiculopathy.  Recommended smoking cessation.  Would screen for AAA at 52.

## 2019-12-13 NOTE — Addendum Note (Signed)
Addended by: Saundra Shelling on: 12/13/2019 04:36 PM   Modules accepted: Orders

## 2019-12-16 ENCOUNTER — Other Ambulatory Visit: Payer: Self-pay

## 2019-12-16 DIAGNOSIS — E7800 Pure hypercholesterolemia, unspecified: Secondary | ICD-10-CM

## 2019-12-16 MED ORDER — ROSUVASTATIN CALCIUM 10 MG PO TABS: 10.0000 mg | ORAL_TABLET | Freq: Every day | ORAL | 3 refills | Status: DC

## 2019-12-20 LAB — COMPLETE METABOLIC PANEL WITH GFR
AG Ratio: 2.1 (calc) (ref 1.0–2.5)
ALT: 55 U/L — ABNORMAL HIGH (ref 9–46)
AST: 26 U/L (ref 10–35)
Albumin: 4.5 g/dL (ref 3.6–5.1)
Alkaline phosphatase (APISO): 74 U/L (ref 35–144)
BUN: 13 mg/dL (ref 7–25)
CO2: 25 mmol/L (ref 20–32)
Calcium: 8.8 mg/dL (ref 8.6–10.3)
Chloride: 106 mmol/L (ref 98–110)
Creat: 0.91 mg/dL (ref 0.70–1.25)
GFR, Est African American: 106 mL/min/{1.73_m2} (ref >=60)
GFR, Est Non African American: 91 mL/min/{1.73_m2} (ref >=60)
Globulin: 2.1 g/dL (calc) (ref 1.9–3.7)
Glucose, Bld: 102 mg/dL — ABNORMAL HIGH (ref 65–99)
Potassium: 4.2 mmol/L (ref 3.5–5.3)
Sodium: 139 mmol/L (ref 135–146)
Total Bilirubin: 0.5 mg/dL (ref 0.2–1.2)
Total Protein: 6.6 g/dL (ref 6.1–8.1)

## 2019-12-20 LAB — LIPID PANEL
Cholesterol: 233 mg/dL — ABNORMAL HIGH (ref <200)
HDL: 73 mg/dL (ref >=40)
LDL Cholesterol (Calc): 142 mg/dL (calc) — ABNORMAL HIGH
Non-HDL Cholesterol (Calc): 160 mg/dL (calc) — ABNORMAL HIGH (ref <130)
Total CHOL/HDL Ratio: 3.2 (calc) (ref <5.0)
Triglycerides: 77 mg/dL (ref <150)

## 2019-12-20 LAB — CBC WITH DIFFERENTIAL/PLATELET
Absolute Monocytes: 437 cells/uL (ref 200–950)
Basophils Absolute: 18 cells/uL (ref 0–200)
Basophils Relative: 0.4 %
Eosinophils Absolute: 50 cells/uL (ref 15–500)
Eosinophils Relative: 1.1 %
HCT: 48.8 % (ref 38.5–50.0)
Hemoglobin: 16.9 g/dL (ref 13.2–17.1)
Lymphs Abs: 1274 cells/uL (ref 850–3900)
MCH: 32.6 pg (ref 27.0–33.0)
MCHC: 34.6 g/dL (ref 32.0–36.0)
MCV: 94 fL (ref 80.0–100.0)
MPV: 9.5 fL (ref 7.5–12.5)
Monocytes Relative: 9.7 %
Neutro Abs: 2723 cells/uL (ref 1500–7800)
Neutrophils Relative %: 60.5 %
Platelets: 210 10*3/uL (ref 140–400)
RBC: 5.19 10*6/uL (ref 4.20–5.80)
RDW: 12.8 % (ref 11.0–15.0)
Total Lymphocyte: 28.3 %
WBC: 4.5 10*3/uL (ref 3.8–10.8)

## 2019-12-20 LAB — B. BURGDORFI ANTIBODIES BY WB

## 2019-12-20 LAB — PSA: PSA: 1.94 ng/mL (ref <=4.0)

## 2020-05-16 ENCOUNTER — Other Ambulatory Visit: Payer: Self-pay | Admitting: *Deleted

## 2020-05-16 DIAGNOSIS — E559 Vitamin D deficiency, unspecified: Secondary | ICD-10-CM

## 2020-05-16 DIAGNOSIS — Z13220 Encounter for screening for lipoid disorders: Secondary | ICD-10-CM

## 2020-05-16 DIAGNOSIS — E7800 Pure hypercholesterolemia, unspecified: Secondary | ICD-10-CM

## 2020-05-16 DIAGNOSIS — Z136 Encounter for screening for cardiovascular disorders: Secondary | ICD-10-CM

## 2020-05-21 ENCOUNTER — Ambulatory Visit: Payer: 59 | Admitting: Family Medicine

## 2020-05-21 ENCOUNTER — Encounter: Payer: Self-pay | Admitting: Family Medicine

## 2020-05-21 ENCOUNTER — Other Ambulatory Visit: Payer: Self-pay

## 2020-05-21 VITALS — BP 128/72 | HR 66 | Temp 98.3°F | Resp 14 | Ht 71.0 in | Wt 177.0 lb

## 2020-05-21 DIAGNOSIS — E7800 Pure hypercholesterolemia, unspecified: Secondary | ICD-10-CM | POA: Diagnosis not present

## 2020-05-21 NOTE — Progress Notes (Signed)
Subjective:    Patient ID: Kyle Lozano, male    DOB: [birth year], 60 y.o.   MRN: [id number]  HPI Patient is a very pleasant 60 year old Caucasian male.  His complete physical exam in October, he was found to have elevated cholesterol.  At that time we started him on Lipitor for hyperlipidemia.  He states that he is tolerating the medication well.  He denies any myalgias or right upper quadrant pain.  He states that he cannot tell that he is taking the medication.  He is here today to recheck his cholesterol.  His blood pressure is outstanding at 128/72 Past Medical History:  Diagnosis Date  . Allergy    seasonal w/ migraines  . HLD (hyperlipidemia)    Past Surgical History:  Procedure Laterality Date  . COLONOSCOPY    . POLYPECTOMY    . WISDOM TOOTH EXTRACTION     Current Outpatient Medications on File Prior to Visit  Medication Sig Dispense Refill  . aspirin 81 MG tablet Take 81 mg by mouth daily.    Marland Kitchen atorvastatin (LIPITOR) 20 MG tablet TAKE 1 TABLET BY MOUTH EVERY DAY 90 tablet 0  . Cholecalciferol (DIALYVITE VITAMIN D 5000) 125 MCG (5000 UT) capsule Take 5,000 Units by mouth daily.    . Omega-3 Fatty Acids (FISH OIL) 1000 MG CAPS Take 1 capsule by mouth daily.    . sildenafil (VIAGRA) 100 MG tablet Take 0.5-1 tablets (50-100 mg total) by mouth daily as needed for erectile dysfunction. 3 tablet 11   No current facility-administered medications on file prior to visit.   No Known Allergies Social History   Socioeconomic History  . Marital status: Married    Spouse name: Not on file  . Number of children: Not on file  . Years of education: Not on file  . Highest education level: Not on file  Occupational History  . Not on file  Tobacco Use  . Smoking status: Current Some Day Smoker    Packs/day: 0.25    Types: Cigarettes  . Smokeless tobacco: Never Used  Substance and Sexual Activity  . Alcohol use: Yes    Alcohol/week: 18.0 standard drinks    Types: 18 Cans of  beer per week  . Drug use: No  . Sexual activity: Not on file  Other Topics Concern  . Not on file  Social History Narrative   He works doing Occupational psychologist   Says his job involves a lot of moving around/ physical activity      Says he only smokes when he drinks on the weekends.   Only drinks on the weekend. Only drinks beer. No other liquor or wine etc   Social Determinants of Health   Financial Resource Strain: Not on file  Food Insecurity: Not on file  Transportation Needs: Not on file  Physical Activity: Not on file  Stress: Not on file  Social Connections: Not on file  Intimate Partner Violence: Not on file   Family History  Problem Relation Age of Onset  . Heart disease Father 30       MI @ 108. No other hx  . Colon cancer Neg Hx   . Colon polyps Neg Hx   . Esophageal cancer Neg Hx   . Rectal cancer Neg Hx   . Stomach cancer Neg Hx       Review of Systems  All other systems reviewed and are negative.      Objective:   Physical Exam  Vitals reviewed.  Constitutional:      General: He is not in acute distress.    Appearance: Normal appearance. He is normal weight. He is not ill-appearing, toxic-appearing or diaphoretic.  HENT:     Head: Normocephalic and atraumatic.     Right Ear: Tympanic membrane and ear canal normal. There is no impacted cerumen.     Left Ear: Tympanic membrane and ear canal normal. There is no impacted cerumen.     Nose: Nose normal. No congestion or rhinorrhea.     Mouth/Throat:     Mouth: Mucous membranes are moist.     Pharynx: Oropharynx is clear. No oropharyngeal exudate or posterior oropharyngeal erythema.  Eyes:     General: No scleral icterus.       Right eye: No discharge.        Left eye: No discharge.     Extraocular Movements: Extraocular movements intact.     Conjunctiva/sclera: Conjunctivae normal.     Pupils: Pupils are equal, round, and reactive to light.  Neck:     Vascular: No carotid bruit.   Cardiovascular:     Rate and Rhythm: Normal rate and regular rhythm.     Pulses: Normal pulses.     Heart sounds: Normal heart sounds. No murmur heard. No friction rub. No gallop.   Pulmonary:     Effort: Pulmonary effort is normal. No respiratory distress.     Breath sounds: Normal breath sounds. No stridor. No wheezing, rhonchi or rales.  Chest:     Chest wall: No tenderness.  Abdominal:     General: Abdomen is flat. Bowel sounds are normal. There is no distension.     Palpations: Abdomen is soft. There is no mass.     Tenderness: There is no abdominal tenderness. There is no right CVA tenderness, left CVA tenderness, guarding or rebound.     Hernia: No hernia is present.  Musculoskeletal:        General: No swelling, tenderness, deformity or signs of injury. Normal range of motion.     Cervical back: Neck supple. No rigidity. No muscular tenderness.     Right lower leg: No edema.     Left lower leg: No edema.  Lymphadenopathy:     Cervical: No cervical adenopathy.  Skin:    General: Skin is warm.     Capillary Refill: Capillary refill takes less than 2 seconds.     Coloration: Skin is not jaundiced or pale.     Findings: No bruising, erythema, lesion or rash.  Neurological:     General: No focal deficit present.     Mental Status: He is alert and oriented to person, place, and time. Mental status is at baseline.     Cranial Nerves: No cranial nerve deficit.     Sensory: No sensory deficit.     Motor: No weakness.     Coordination: Coordination normal.     Gait: Gait normal.     Deep Tendon Reflexes: Reflexes normal.           Assessment & Plan:  Pure hypercholesterolemia - Plan: COMPLETE METABOLIC PANEL WITH GFR, Lipid panel  I would like to see his LDL cholesterol less than 100.  I will check a CMP and a fasting lipid panel today.  The patient denies any side effects from the medications of his LDL cholesterol is less than 100 we will make no additional changes.

## 2020-05-22 LAB — COMPLETE METABOLIC PANEL WITH GFR
AG Ratio: 2 (calc) (ref 1.0–2.5)
ALT: 44 U/L (ref 9–46)
AST: 28 U/L (ref 10–35)
Albumin: 4.5 g/dL (ref 3.6–5.1)
Alkaline phosphatase (APISO): 69 U/L (ref 35–144)
BUN: 11 mg/dL (ref 7–25)
CO2: 22 mmol/L (ref 20–32)
Calcium: 9.4 mg/dL (ref 8.6–10.3)
Chloride: 108 mmol/L (ref 98–110)
Creat: 0.92 mg/dL (ref 0.70–1.25)
GFR, Est African American: 104 mL/min/{1.73_m2} (ref >=60)
GFR, Est Non African American: 90 mL/min/{1.73_m2} (ref >=60)
Globulin: 2.2 g/dL (calc) (ref 1.9–3.7)
Glucose, Bld: 129 mg/dL — ABNORMAL HIGH (ref 65–99)
Potassium: 4.3 mmol/L (ref 3.5–5.3)
Sodium: 143 mmol/L (ref 135–146)
Total Bilirubin: 0.6 mg/dL (ref 0.2–1.2)
Total Protein: 6.7 g/dL (ref 6.1–8.1)

## 2020-05-22 LAB — LIPID PANEL
Cholesterol: 151 mg/dL (ref <200)
HDL: 60 mg/dL (ref >=40)
LDL Cholesterol (Calc): 77 mg/dL (calc)
Non-HDL Cholesterol (Calc): 91 mg/dL (calc) (ref <130)
Total CHOL/HDL Ratio: 2.5 (calc) (ref <5.0)
Triglycerides: 64 mg/dL (ref <150)

## 2020-12-13 ENCOUNTER — Encounter: Payer: Self-pay | Admitting: Family Medicine

## 2020-12-13 ENCOUNTER — Other Ambulatory Visit: Payer: 59

## 2020-12-13 ENCOUNTER — Ambulatory Visit (INDEPENDENT_AMBULATORY_CARE_PROVIDER_SITE_OTHER): Payer: 59 | Admitting: Family Medicine

## 2020-12-13 ENCOUNTER — Other Ambulatory Visit: Payer: Self-pay

## 2020-12-13 VITALS — BP 132/80 | HR 77 | Temp 97.9°F | Ht 71.0 in | Wt 176.6 lb

## 2020-12-13 DIAGNOSIS — Z23 Encounter for immunization: Secondary | ICD-10-CM

## 2020-12-13 DIAGNOSIS — F17200 Nicotine dependence, unspecified, uncomplicated: Secondary | ICD-10-CM | POA: Diagnosis not present

## 2020-12-13 DIAGNOSIS — Z1211 Encounter for screening for malignant neoplasm of colon: Secondary | ICD-10-CM

## 2020-12-13 DIAGNOSIS — Z0000 Encounter for general adult medical examination without abnormal findings: Secondary | ICD-10-CM | POA: Diagnosis not present

## 2020-12-13 DIAGNOSIS — E7800 Pure hypercholesterolemia, unspecified: Secondary | ICD-10-CM

## 2020-12-13 NOTE — Progress Notes (Signed)
Subjective:    Patient ID: Kyle Lozano, male    DOB: Aug 04, 1960, 60 y.o.   MRN: [id number]  HPI Patient is a very pleasant 60 year old Caucasian gentleman who presents today for complete physical exam.  He denies any medical concerns.  His blood pressure is excellent at 132/80.  Unfortunately he continues to smoke and he has greater than 30-pack-year history of smoking.  He is due for a flu shot today.  He is also due for the shingles vaccine and a COVID booster.  I recommended the shingles vaccine and a COVID booster to him.  We do not have these in our clinic but he can get them at his local pharmacy.  He would like to get the flu shot here today.  His last colonoscopy was 2017 and due to the presence of polyps they recommended a repeat colonoscopy in 5 years making him due now.  He is also due for PSA.  He has not had a CT scan to screen for lung cancer.  We discussed this as well today Past Medical History:  Diagnosis Date   Allergy    seasonal w/ migraines   HLD (hyperlipidemia)    Past Surgical History:  Procedure Laterality Date   COLONOSCOPY     POLYPECTOMY     WISDOM TOOTH EXTRACTION     Current Outpatient Medications on File Prior to Visit  Medication Sig Dispense Refill   aspirin 81 MG tablet Take 81 mg by mouth daily.     Cholecalciferol (DIALYVITE VITAMIN D 5000) 125 MCG (5000 UT) capsule Take 5,000 Units by mouth daily.     Omega-3 Fatty Acids (FISH OIL) 1000 MG CAPS Take 1 capsule by mouth daily.     rosuvastatin (CRESTOR) 10 MG tablet Take 10 mg by mouth daily.     sildenafil (VIAGRA) 100 MG tablet Take 0.5-1 tablets (50-100 mg total) by mouth daily as needed for erectile dysfunction. 3 tablet 11   No current facility-administered medications on file prior to visit.   No Known Allergies Social History   Socioeconomic History   Marital status: Married    Spouse name: Not on file   Number of children: Not on file   Years of education: Not on file   Highest  education level: Not on file  Occupational History   Not on file  Tobacco Use   Smoking status: Some Days    Packs/day: 0.25    Types: Cigarettes   Smokeless tobacco: Never  Substance and Sexual Activity   Alcohol use: Yes    Alcohol/week: 18.0 standard drinks    Types: 18 Cans of beer per week   Drug use: No   Sexual activity: Not on file  Other Topics Concern   Not on file  Social History Narrative   He works doing Occupational psychologist   Says his job involves a lot of moving around/ physical activity      Says he only smokes when he drinks on the weekends.   Only drinks on the weekend. Only drinks beer. No other liquor or wine etc   Social Determinants of Health   Financial Resource Strain: Not on file  Food Insecurity: Not on file  Transportation Needs: Not on file  Physical Activity: Not on file  Stress: Not on file  Social Connections: Not on file  Intimate Partner Violence: Not on file   Family History  Problem Relation Age of Onset   Heart disease Father 9  MI @ 71. No other hx   Colon cancer Neg Hx    Colon polyps Neg Hx    Esophageal cancer Neg Hx    Rectal cancer Neg Hx    Stomach cancer Neg Hx       Review of Systems  All other systems reviewed and are negative.     Objective:   Physical Exam Vitals reviewed.  Constitutional:      General: He is not in acute distress.    Appearance: Normal appearance. He is normal weight. He is not ill-appearing, toxic-appearing or diaphoretic.  HENT:     Head: Normocephalic and atraumatic.     Right Ear: Tympanic membrane and ear canal normal. There is no impacted cerumen.     Left Ear: Tympanic membrane and ear canal normal. There is no impacted cerumen.     Nose: Nose normal. No congestion or rhinorrhea.     Mouth/Throat:     Mouth: Mucous membranes are moist.     Pharynx: Oropharynx is clear. No oropharyngeal exudate or posterior oropharyngeal erythema.  Eyes:     General: No scleral icterus.        Right eye: No discharge.        Left eye: No discharge.     Extraocular Movements: Extraocular movements intact.     Conjunctiva/sclera: Conjunctivae normal.     Pupils: Pupils are equal, round, and reactive to light.  Neck:     Vascular: No carotid bruit.  Cardiovascular:     Rate and Rhythm: Normal rate and regular rhythm.     Pulses: Normal pulses.     Heart sounds: Normal heart sounds. No murmur heard.   No friction rub. No gallop.  Pulmonary:     Effort: Pulmonary effort is normal. No respiratory distress.     Breath sounds: Normal breath sounds. No stridor. No wheezing, rhonchi or rales.  Chest:     Chest wall: No tenderness.  Abdominal:     General: Abdomen is flat. Bowel sounds are normal. There is no distension.     Palpations: Abdomen is soft. There is no mass.     Tenderness: There is no abdominal tenderness. There is no right CVA tenderness, left CVA tenderness, guarding or rebound.     Hernia: No hernia is present.  Musculoskeletal:        General: No swelling, tenderness, deformity or signs of injury. Normal range of motion.     Cervical back: Neck supple. No rigidity. No muscular tenderness.     Right lower leg: No edema.     Left lower leg: No edema.  Lymphadenopathy:     Cervical: No cervical adenopathy.  Skin:    General: Skin is warm.     Capillary Refill: Capillary refill takes less than 2 seconds.     Coloration: Skin is not jaundiced or pale.     Findings: No bruising, erythema, lesion or rash.  Neurological:     General: No focal deficit present.     Mental Status: He is alert and oriented to person, place, and time. Mental status is at baseline.     Cranial Nerves: No cranial nerve deficit.     Sensory: No sensory deficit.     Motor: No weakness.     Coordination: Coordination normal.     Gait: Gait normal.     Deep Tendon Reflexes: Reflexes normal.          Assessment & Plan:  Screen for colon cancer -  Plan: Ambulatory referral to  Gastroenterology  General medical exam - Plan: CBC with Differential/Platelet, COMPLETE METABOLIC PANEL WITH GFR, Lipid panel, PSA  Pure hypercholesterolemia  Smoker Patient received his flu shot today.  Recommended the shingles vaccine as well as COVID booster.  I will contact his gastroenterologist about scheduling his colonoscopy.  I will screen for prostate cancer with a PSA.  While checking blood work I will also check a CBC CMP and a fasting lipid panel.  Ideally I like his LDL cholesterol to be below 100.  Continue to encourage smoking cessation.  Also recommended a CT scan of the lungs to screen for lung cancer.  At the present time, the patient states he is extremely busy.  If he changes his mind, I will be glad to schedule the CT scan at his convenience.

## 2020-12-14 ENCOUNTER — Other Ambulatory Visit: Payer: Self-pay | Admitting: *Deleted

## 2020-12-14 DIAGNOSIS — R7989 Other specified abnormal findings of blood chemistry: Secondary | ICD-10-CM

## 2020-12-14 LAB — COMPLETE METABOLIC PANEL WITH GFR
AG Ratio: 2.4 (calc) (ref 1.0–2.5)
ALT: 51 U/L — ABNORMAL HIGH (ref 9–46)
AST: 36 U/L — ABNORMAL HIGH (ref 10–35)
Albumin: 4.6 g/dL (ref 3.6–5.1)
Alkaline phosphatase (APISO): 69 U/L (ref 35–144)
BUN: 14 mg/dL (ref 7–25)
CO2: 25 mmol/L (ref 20–32)
Calcium: 9.2 mg/dL (ref 8.6–10.3)
Chloride: 106 mmol/L (ref 98–110)
Creat: 0.88 mg/dL (ref 0.70–1.35)
Globulin: 1.9 g/dL (calc) (ref 1.9–3.7)
Glucose, Bld: 123 mg/dL — ABNORMAL HIGH (ref 65–99)
Potassium: 4.2 mmol/L (ref 3.5–5.3)
Sodium: 140 mmol/L (ref 135–146)
Total Bilirubin: 0.9 mg/dL (ref 0.2–1.2)
Total Protein: 6.5 g/dL (ref 6.1–8.1)
eGFR: 98 mL/min/{1.73_m2} (ref >=60)

## 2020-12-14 LAB — CBC WITH DIFFERENTIAL/PLATELET
Absolute Monocytes: 555 cells/uL (ref 200–950)
Basophils Absolute: 51 cells/uL (ref 0–200)
Basophils Relative: 0.7 %
Eosinophils Absolute: 88 cells/uL (ref 15–500)
Eosinophils Relative: 1.2 %
HCT: 48.5 % (ref 38.5–50.0)
Hemoglobin: 17.1 g/dL (ref 13.2–17.1)
Lymphs Abs: 1752 cells/uL (ref 850–3900)
MCH: 33 pg (ref 27.0–33.0)
MCHC: 35.3 g/dL (ref 32.0–36.0)
MCV: 93.6 fL (ref 80.0–100.0)
MPV: 9.9 fL (ref 7.5–12.5)
Monocytes Relative: 7.6 %
Neutro Abs: 4855 cells/uL (ref 1500–7800)
Neutrophils Relative %: 66.5 %
Platelets: 181 10*3/uL (ref 140–400)
RBC: 5.18 10*6/uL (ref 4.20–5.80)
RDW: 12.5 % (ref 11.0–15.0)
Total Lymphocyte: 24 %
WBC: 7.3 10*3/uL (ref 3.8–10.8)

## 2020-12-14 LAB — LIPID PANEL
Cholesterol: 136 mg/dL (ref <200)
HDL: 69 mg/dL (ref >=40)
LDL Cholesterol (Calc): 54 mg/dL (calc)
Non-HDL Cholesterol (Calc): 67 mg/dL (calc) (ref <130)
Total CHOL/HDL Ratio: 2 (calc) (ref <5.0)
Triglycerides: 53 mg/dL (ref <150)

## 2020-12-14 LAB — PSA: PSA: 1.41 ng/mL (ref <=4.00)

## 2020-12-20 ENCOUNTER — Ambulatory Visit
Admission: RE | Admit: 2020-12-20 | Discharge: 2020-12-20 | Disposition: A | Discharge status: Home / Self Care | Payer: 59 | Source: Ambulatory Visit | Attending: Family Medicine | Admitting: Family Medicine

## 2020-12-20 ENCOUNTER — Other Ambulatory Visit: Payer: Self-pay

## 2020-12-20 DIAGNOSIS — R7989 Other specified abnormal findings of blood chemistry: Secondary | ICD-10-CM

## 2021-02-07 ENCOUNTER — Encounter: Payer: Self-pay | Admitting: Gastroenterology

## 2021-03-06 ENCOUNTER — Ambulatory Visit: Payer: 59 | Admitting: Nurse Practitioner

## 2021-03-06 ENCOUNTER — Encounter: Payer: Self-pay | Admitting: Nurse Practitioner

## 2021-03-06 ENCOUNTER — Other Ambulatory Visit: Payer: Self-pay

## 2021-03-06 VITALS — BP 132/88 | HR 90 | Ht 71.0 in | Wt 176.0 lb

## 2021-03-06 DIAGNOSIS — R42 Dizziness and giddiness: Secondary | ICD-10-CM | POA: Diagnosis not present

## 2021-03-06 DIAGNOSIS — R2681 Unsteadiness on feet: Secondary | ICD-10-CM | POA: Diagnosis not present

## 2021-03-06 MED ORDER — MECLIZINE HCL 25 MG PO TABS: 25.0000 mg | ORAL_TABLET | Freq: Three times a day (TID) | ORAL | 0 refills | Status: DC | PRN

## 2021-03-06 NOTE — Progress Notes (Addendum)
Subjective:    Patient ID: Kyle Lozano, male    DOB: 08-Aug-1960, 61 y.o.   MRN: [id number]  HPI: Kyle Lozano is a 61 y.o. male presenting for dizzy episodes.  Chief Complaint  Patient presents with   Dizziness    Ongoing for about 3 weeks   DIZZINESS Patient reports abut 5 episodes of dizziness over the past few weeks.  Reports the symptoms only happen at work.  Duration: weeks Description of symptoms: vision flutters, feels spinning sensation Duration of episode: minutes - 10 or so Dizziness frequency: recurrent Aggravating/provoking factors:  nothing known, being at work Triggered by rolling over in bed: no Triggered by bending over: no Aggravated by head movement: no Aggravated by exertion: no Aggravated by coughing: no Aggravated by loud noises: no Recent head injury: no Recent or current viral symptoms: no History of vasovagal episodes: no Nausea: no Vomiting: no Tinnitus: no Hearing loss: no Aural fullness: no Headache: no Photophobia: no Phonophobia: no Unsteady gait: yes Postural instability: no Diplopia: yes Dysarthria: no Dysphagia: no Weakness: no Related to exertion: no Pallor: no Diaphoresis: no Dyspnea: no Chest pain: no  Reports the symptoms improve if he covers one eye.  The symptoms completely improved after about 10 minutes. He does feel very unsteady on his feet and at times does feel like he could pass out, so he has to grab on to something or lean against a wall.  His vision has not gone black and he has never passed out with this dizziness.  No Known Allergies  Outpatient Encounter Medications as of 03/06/2021  Medication Sig   aspirin 81 MG tablet Take 81 mg by mouth daily.   meclizine (ANTIVERT) 25 MG tablet Take 1 tablet (25 mg total) by mouth 3 (three) times daily as needed for dizziness.   Omega-3 Fatty Acids (FISH OIL) 1000 MG CAPS Take 1 capsule by mouth daily.   sildenafil (VIAGRA) 100 MG tablet Take 0.5-1 tablets  (50-100 mg total) by mouth daily as needed for erectile dysfunction.   [DISCONTINUED] Cholecalciferol (DIALYVITE VITAMIN D 5000) 125 MCG (5000 UT) capsule Take 5,000 Units by mouth daily.   [DISCONTINUED] rosuvastatin (CRESTOR) 10 MG tablet Take 10 mg by mouth daily.   No facility-administered encounter medications on file as of 03/06/2021.    Patient Active Problem List   Diagnosis Date Noted   HLD (hyperlipidemia)    Pain in left knee 11/11/2017   Erectile dysfunction 05/28/2017   Decreased libido 05/28/2017   Vitamin D deficiency 03/08/2015    Past Medical History:  Diagnosis Date   Allergy    seasonal w/ migraines   HLD (hyperlipidemia)     Relevant past medical, surgical, family and social history reviewed and updated as indicated. Interim medical history since our last visit reviewed.  Review of Systems Per HPI unless specifically indicated above     Objective:    BP 132/88    Pulse 90    Ht 5\' 11"  (1.803 m)    Wt 176 lb (79.8 kg)    SpO2 96%    BMI 24.55 kg/m   Wt Readings from Last 3 Encounters:  03/06/21 176 lb (79.8 kg)  12/13/20 176 lb 9.6 oz (80.1 kg)  05/21/20 177 lb (80.3 kg)    Physical Exam Vitals and nursing note reviewed.  Constitutional:      General: He is not in acute distress.    Appearance: Normal appearance. He is not toxic-appearing.  Eyes:  General: No visual field deficit or scleral icterus.       Right eye: No discharge.        Left eye: No discharge.     Extraocular Movements: Extraocular movements intact.     Right eye: Normal extraocular motion and no nystagmus.     Left eye: Normal extraocular motion and no nystagmus.     Conjunctiva/sclera: Conjunctivae normal.     Pupils: Pupils are equal, round, and reactive to light.  Neck:     Vascular: No carotid bruit.  Cardiovascular:     Rate and Rhythm: Normal rate and regular rhythm.     Heart sounds: Normal heart sounds. No murmur heard. Pulmonary:     Effort: Pulmonary effort is  normal. No respiratory distress.     Breath sounds: Normal breath sounds. No wheezing, rhonchi or rales.  Skin:    General: Skin is warm and dry.     Coloration: Skin is not jaundiced or pale.     Findings: No erythema.  Neurological:     Mental Status: He is alert and oriented to person, place, and time.     Cranial Nerves: Cranial nerves 2-12 are intact. No dysarthria or facial asymmetry.     Sensory: Sensation is intact. No sensory deficit.     Motor: No weakness.     Coordination: Romberg sign negative. Coordination normal. Finger-Nose-Finger Test and Heel to [facility] Test normal. Rapid alternating movements normal.     Gait: Gait normal.  Psychiatric:        Mood and Affect: Mood normal.        Behavior: Behavior normal.        Thought Content: Thought content normal.        Judgment: Judgment normal.      Assessment & Plan:  1. Vertigo Acute.  No red flags in history or on examination today.  Neurological examination is unremarkable today.  Symptoms consistent with vertigo, possibly vestibular migraine.  Discussed getting plenty of water intake and adequate sleep.  Try to keep journal to determine possible trigger/exposure at work.  Can start trial of antivert at onset of symptoms.  Follow up if symptoms worsen or persist.   - meclizine (ANTIVERT) 25 MG tablet; Take 1 tablet (25 mg total) by mouth 3 (three) times daily as needed for dizziness.  Dispense: 30 tablet; Refill: 0    Addendum 1/19: after discussion of above case with supervising physician, decision made to obtain MRI imaging of the brain to check for acute process.  I called and spoke with the patient and he is in agreement to this.  He reports today when he started to have an episode, he took one of the meclizine pills and reports his symptoms improved shortly thereafter.   Follow up plan: Return if symptoms worsen or fail to improve.

## 2021-03-07 NOTE — Addendum Note (Signed)
Addended by: Noemi Chapel A on: 03/07/2021 01:41 PM   Modules accepted: Orders

## 2021-03-31 ENCOUNTER — Ambulatory Visit
Admission: RE | Admit: 2021-03-31 | Discharge: 2021-03-31 | Disposition: A | Discharge status: Home / Self Care | Payer: 59 | Source: Ambulatory Visit | Attending: Nurse Practitioner | Admitting: Nurse Practitioner

## 2021-03-31 ENCOUNTER — Other Ambulatory Visit: Payer: Self-pay

## 2021-03-31 ENCOUNTER — Other Ambulatory Visit: Payer: Self-pay | Admitting: Nurse Practitioner

## 2021-03-31 DIAGNOSIS — Z77018 Contact with and (suspected) exposure to other hazardous metals: Secondary | ICD-10-CM

## 2021-03-31 DIAGNOSIS — R2681 Unsteadiness on feet: Secondary | ICD-10-CM

## 2021-03-31 DIAGNOSIS — R42 Dizziness and giddiness: Secondary | ICD-10-CM

## 2021-04-05 ENCOUNTER — Ambulatory Visit
Admission: RE | Admit: 2021-04-05 | Discharge: 2021-04-05 | Disposition: A | Discharge status: Home / Self Care | Payer: 59 | Source: Ambulatory Visit | Attending: Nurse Practitioner | Admitting: Nurse Practitioner

## 2021-04-05 ENCOUNTER — Other Ambulatory Visit: Payer: 59

## 2021-04-05 ENCOUNTER — Other Ambulatory Visit: Payer: Self-pay

## 2021-04-05 DIAGNOSIS — Z77018 Contact with and (suspected) exposure to other hazardous metals: Secondary | ICD-10-CM

## 2021-04-05 MED ORDER — GADOBENATE DIMEGLUMINE 529 MG/ML IV SOLN
15.0000 mL | Freq: Once | INTRAVENOUS | Status: AC | PRN
  Administered 2021-04-05: 15 mL via INTRAVENOUS

## 2021-04-09 ENCOUNTER — Telehealth: Payer: Self-pay

## 2021-04-09 NOTE — Telephone Encounter (Signed)
Patient advised and has been scheduled for OV. Nothing further needed at this time.

## 2021-04-09 NOTE — Telephone Encounter (Signed)
Patient called to request results from recent MRI.  Results likely went to [place], as she ordered exam.  Please advise, thank you!

## 2021-04-12 ENCOUNTER — Ambulatory Visit: Payer: 59 | Admitting: Family Medicine

## 2021-04-12 ENCOUNTER — Encounter: Payer: Self-pay | Admitting: Family Medicine

## 2021-04-12 ENCOUNTER — Other Ambulatory Visit: Payer: Self-pay

## 2021-04-12 VITALS — BP 128/72 | HR 95 | Temp 98.1°F | Resp 18 | Ht 71.0 in | Wt 176.0 lb

## 2021-04-12 DIAGNOSIS — I6350 Cerebral infarction due to unspecified occlusion or stenosis of unspecified cerebral artery: Secondary | ICD-10-CM | POA: Diagnosis not present

## 2021-04-12 NOTE — Progress Notes (Signed)
Subjective:    Patient ID: Kyle Lozano, male    DOB: [birth year], 61 y.o.   MRN: [id number]  HPI FINDINGS: Brain: There is no evidence of an acute infarct, mass, midline shift, or extra-axial fluid collection. T2 hyperintensities scattered throughout the cerebral white matter bilaterally are nonspecific but compatible with mild-to-moderate chronic small vessel ischemic disease. There are small chronic infarcts in the left cerebellar hemisphere and left thalamus. The ventricles are normal in size.   There is a large vein anteriorly in the right frontal lobe which courses from the frontal horn of the right lateral ventricle anteriorly to the convexity and is contiguous with a cortical vein which ultimately drains into the superior sagittal sinus. 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 No associated nidus or enlarged arteries are seen to suggest the presence of an AVM/high-flow vascular malformation. There is minimal gliosis in the surrounding right frontal brain parenchyma.   Vascular: Major intracranial vascular flow voids are preserved.   Skull and upper cervical spine: Unremarkable bone marrow signal.   Sinuses/Orbits: Unremarkable orbits. Mild mucosal thickening in the paranasal sinuses. Clear mastoid air cells.  Above is the findings of the MRI that was recently performed.  Patient saw my partner for vertigo and blurry vision.  She ordered an MRI after the evaluation.  On the MRI, the patient was found to have chronic small strokes in the left cerebellum, left thalamus, and chronic microvascular disease throughout.  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  However there was no areas of AVM or high flow vascular malformation.  Patient is here today to discuss the results.  He is still smoking.  He does have prediabetes.  However his blood  pressure is well controlled at 128/72.  He is on an aspirin.  And his LDL cholesterol was last found to be 54 well controlled. Past Medical History:  Diagnosis Date   Allergy    seasonal w/ migraines   HLD (hyperlipidemia)    Past Surgical History:  Procedure Laterality Date   COLONOSCOPY     POLYPECTOMY     WISDOM TOOTH EXTRACTION     Current Outpatient Medications on File Prior to Visit  Medication Sig Dispense Refill   aspirin 81 MG tablet Take 81 mg by mouth daily.     meclizine (ANTIVERT) 25 MG tablet Take 1 tablet (25 mg total) by mouth 3 (three) times daily as needed for dizziness. 30 tablet 0   Omega-3 Fatty Acids (FISH OIL) 1000 MG CAPS Take 1 capsule by mouth daily.     sildenafil (VIAGRA) 100 MG tablet Take 0.5-1 tablets (50-100 mg total) by mouth daily as needed for erectile dysfunction. 3 tablet 11   No current facility-administered medications on file prior to visit.   No Known Allergies Social History   Socioeconomic History   Marital status: Married    Spouse name: Not on file   Number of children: Not on file   Years of education: Not on file   Highest education level: Not on file  Occupational History   Not on file  Tobacco Use   Smoking status: Some Days    Packs/day: 0.50    Types: Cigarettes    Start date: 02/18/1979   Smokeless tobacco: Never  Substance and Sexual Activity   Alcohol use: Yes    Alcohol/week: 18.0 standard drinks    Types:  18 Cans of beer per week   Drug use: No   Sexual activity: Not on file  Other Topics Concern   Not on file  Social History Narrative   He works doing Occupational psychologist   Says his job involves a lot of moving around/ physical activity      Says he only smokes when he drinks on the weekends.   Only drinks on the weekend. Only drinks beer. No other liquor or wine etc   Social Determinants of Health   Financial Resource Strain: Not on file  Food Insecurity: Not on file  Transportation Needs: Not on file   Physical Activity: Not on file  Stress: Not on file  Social Connections: Not on file  Intimate Partner Violence: Not on file   Family History  Problem Relation Age of Onset   Heart disease Father 42       MI @ 72. No other hx   Colon cancer Neg Hx    Colon polyps Neg Hx    Esophageal cancer Neg Hx    Rectal cancer Neg Hx    Stomach cancer Neg Hx       Review of Systems  All other systems reviewed and are negative.     Objective:   Physical Exam Vitals reviewed.  Constitutional:      General: He is not in acute distress.    Appearance: Normal appearance. He is normal weight. He is not ill-appearing, toxic-appearing or diaphoretic.  HENT:     Head: Normocephalic and atraumatic.     Right Ear: Tympanic membrane and ear canal normal. There is no impacted cerumen.     Left Ear: Tympanic membrane and ear canal normal. There is no impacted cerumen.     Nose: Nose normal. No congestion or rhinorrhea.     Mouth/Throat:     Mouth: Mucous membranes are moist.     Pharynx: Oropharynx is clear. No oropharyngeal exudate or posterior oropharyngeal erythema.  Eyes:     General: No scleral icterus.       Right eye: No discharge.        Left eye: No discharge.     Extraocular Movements: Extraocular movements intact.     Conjunctiva/sclera: Conjunctivae normal.     Pupils: Pupils are equal, round, and reactive to light.  Neck:     Vascular: No carotid bruit.  Cardiovascular:     Rate and Rhythm: Normal rate and regular rhythm.     Pulses: Normal pulses.     Heart sounds: Normal heart sounds. No murmur heard.   No friction rub. No gallop.  Pulmonary:     Effort: Pulmonary effort is normal. No respiratory distress.     Breath sounds: Normal breath sounds. No stridor. No wheezing, rhonchi or rales.  Chest:     Chest wall: No tenderness.  Abdominal:     General: Abdomen is flat. Bowel sounds are normal. There is no distension.     Palpations: Abdomen is soft. There is no mass.      Tenderness: There is no abdominal tenderness. There is no right CVA tenderness, left CVA tenderness, guarding or rebound.     Hernia: No hernia is present.  Musculoskeletal:        General: No swelling, tenderness, deformity or signs of injury. Normal range of motion.     Cervical back: Neck supple. No rigidity. No muscular tenderness.     Right lower leg: No edema.  Left lower leg: No edema.  Lymphadenopathy:     Cervical: No cervical adenopathy.  Skin:    General: Skin is warm.     Capillary Refill: Capillary refill takes less than 2 seconds.     Coloration: Skin is not jaundiced or pale.     Findings: No bruising, erythema, lesion or rash.  Neurological:     General: No focal deficit present.     Mental Status: He is alert and oriented to person, place, and time. Mental status is at baseline.     Cranial Nerves: No cranial nerve deficit.     Sensory: No sensory deficit.     Motor: No weakness.     Coordination: Coordination normal.     Gait: Gait normal.     Deep Tendon Reflexes: Reflexes normal.          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  Therefore I will obtain an MRA of the head and neck to evaluate for potential sources in an effort to help reduce the risk of a future stroke.  Patient is already on an aspirin, his LDL cholesterol is well below 70, his blood pressure is well controlled.  Therefore his biggest uncontrolled risk factor is smoking with encouraged him to quit smoking.  Await results of MRA

## 2021-04-26 ENCOUNTER — Ambulatory Visit
Admission: RE | Admit: 2021-04-26 | Discharge: 2021-04-26 | Disposition: A | Discharge status: Home / Self Care | Payer: 59 | Source: Ambulatory Visit | Attending: Family Medicine | Admitting: Family Medicine

## 2021-04-26 ENCOUNTER — Other Ambulatory Visit: Payer: Self-pay

## 2021-04-26 DIAGNOSIS — I6350 Cerebral infarction due to unspecified occlusion or stenosis of unspecified cerebral artery: Secondary | ICD-10-CM

## 2021-04-26 MED ORDER — GADOBENATE DIMEGLUMINE 529 MG/ML IV SOLN
16.0000 mL | Freq: Once | INTRAVENOUS | Status: AC | PRN
  Administered 2021-04-26: 16 mL via INTRAVENOUS

## 2022-04-03 IMAGING — CR DG ORBITS FOR FOREIGN BODY
2 series · 2 of 2 positions shown · non-contrast
Comparison: none

CLINICAL DATA: Diplopia head, dizziness, history of metal exposure

EXAM:
ORBITS FOR FOREIGN BODY - 2 VIEW

[w orbit pa (1 of 2)]
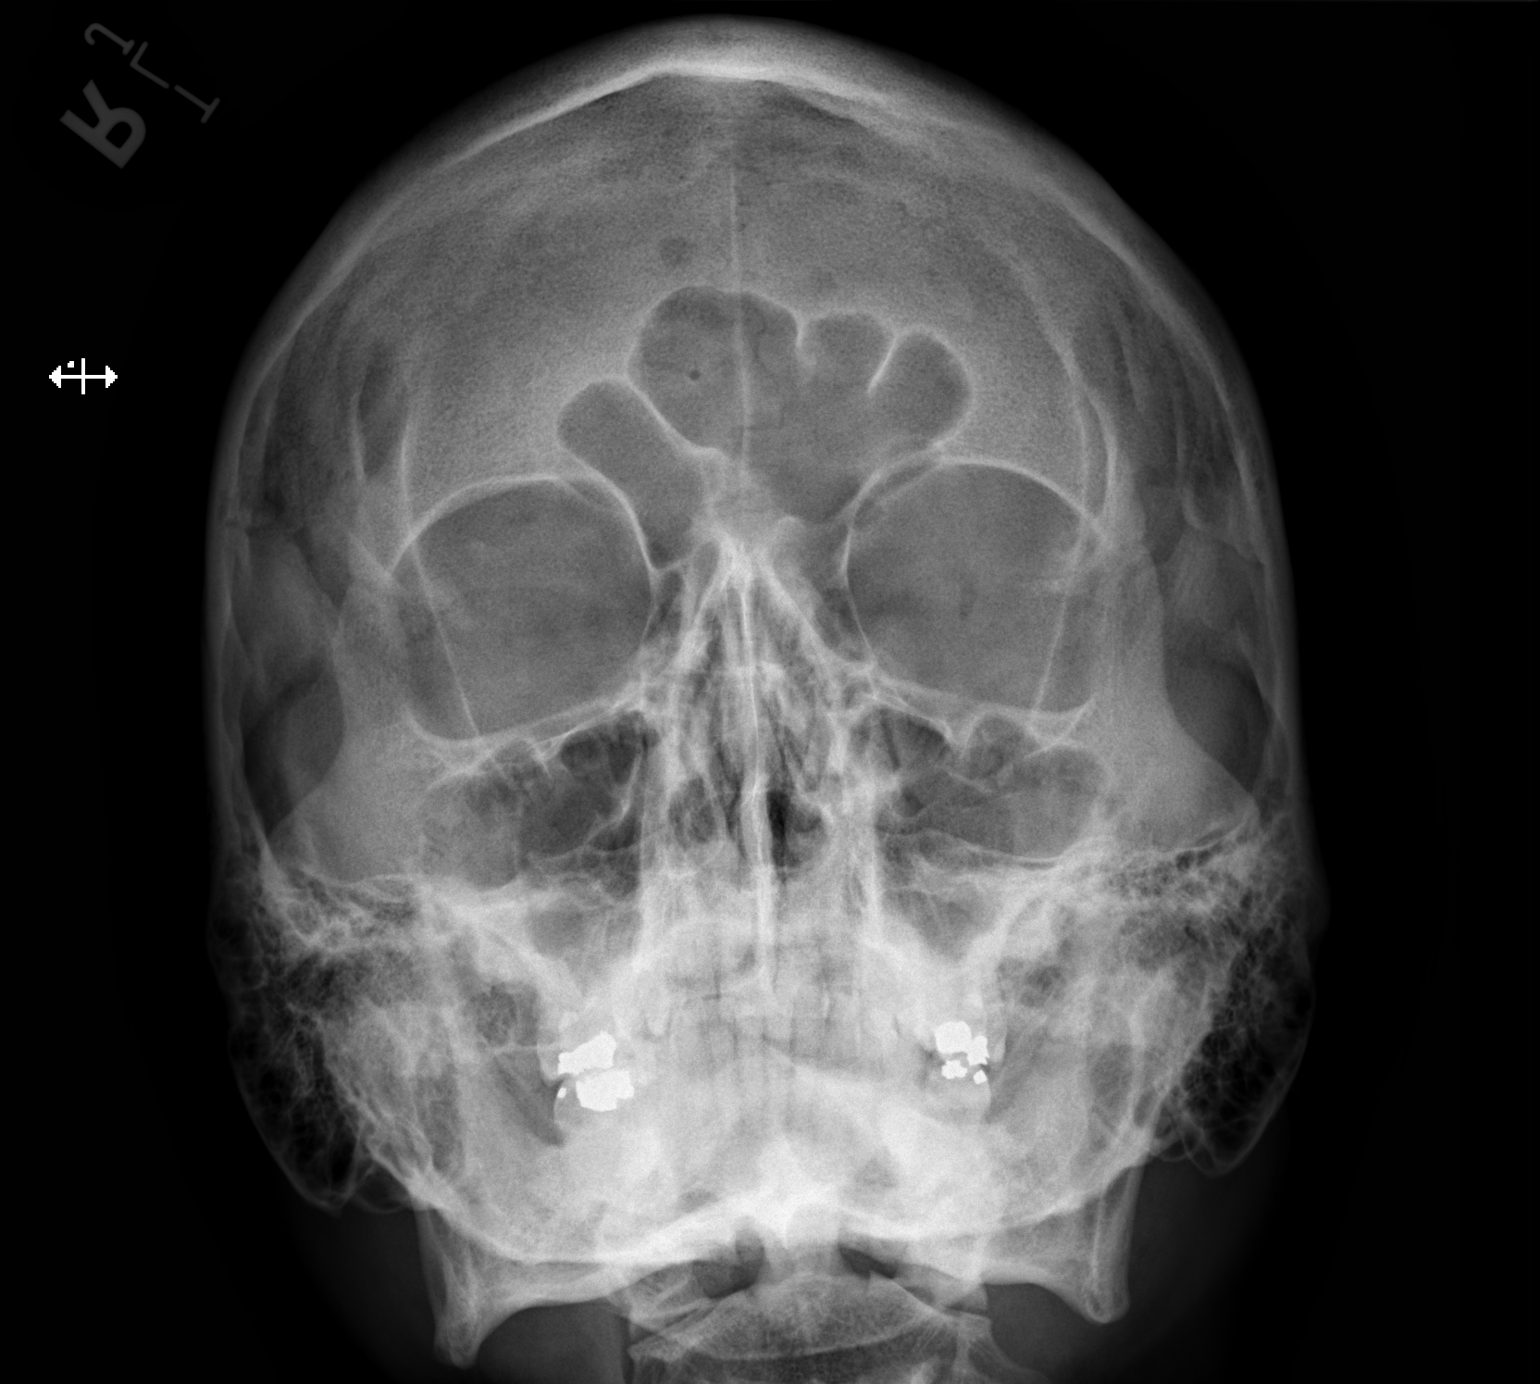

[w orbit pa (2 of 2)]
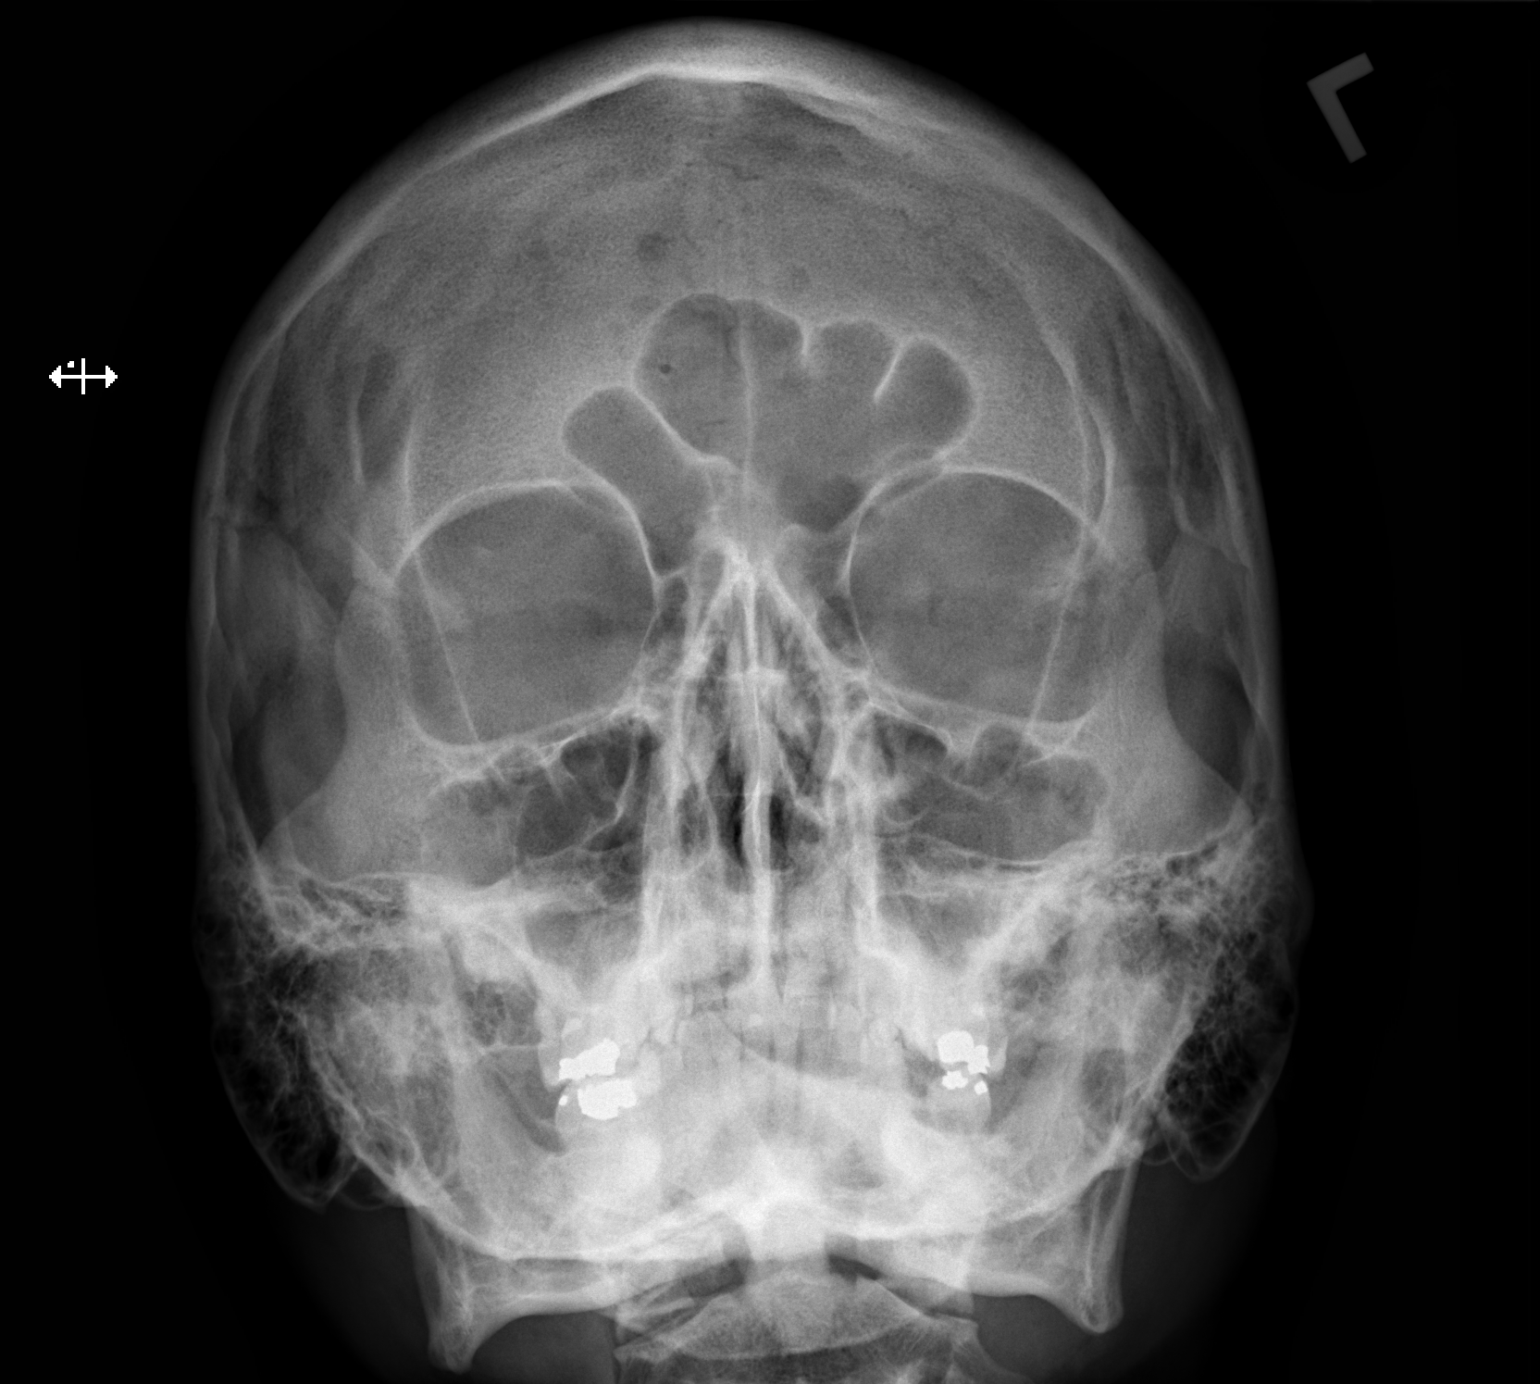

[2 of 2 positions shown; findings below may reference images not displayed]

FINDINGS: There is no evidence of metallic foreign body within the orbits. No
significant bone abnormality identified. Dental restorations noted.
IMPRESSION: No evidence of metallic foreign body within the orbits.

## 2022-04-03 IMAGING — MR MR HEAD WO/W CM
12 series · 48 of 48 positions shown · IV contrast (multihance)
Comparison: None.

CLINICAL DATA: Vertigo/dizziness.  Diplopia.  Unsteadiness.

EXAM:
MRI HEAD WITHOUT AND WITH CONTRAST
TECHNIQUE: Multiplanar, multiecho pulse sequences of the brain and surrounding
structures were obtained without and with intravenous contrast.
CONTRAST:  15mL MULTIHANCE GADOBENATE DIMEGLUMINE 529 MG/ML IV SOLN

[Series 2: T1 · sagittal · 5.0mm · 0.49mm/px · 1 of 26 slices shown]
[im 1/26]
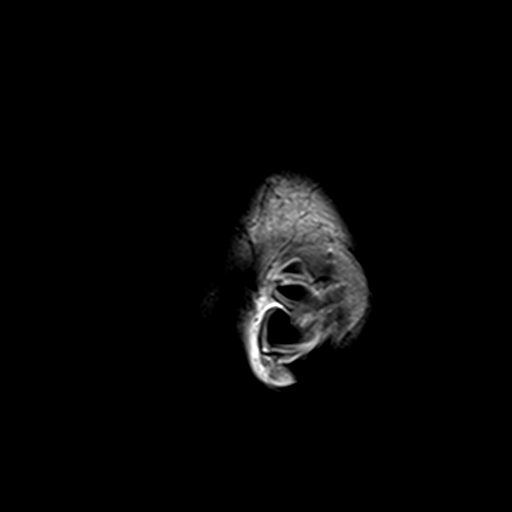

[Series 3: DWI · axial · 3.0mm · 1.95mm/px · z∈[-62,+97]mm · 7 of 108 slices shown (1 of 4)]
[im 1/108]
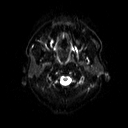
[im 18/108]
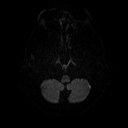
[im 36/108]
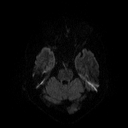
[im 54/108]
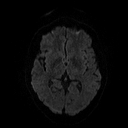
[im 72/108]
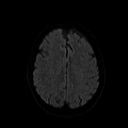
[im 90/108]
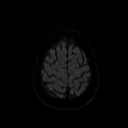
[im 108/108]
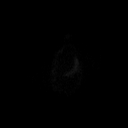

[Series 4: DWI · axial · 3.0mm · 1.95mm/px · z∈[-62,+97]mm · 3 of 50 slices shown (2 of 4)]
[im 1/50]
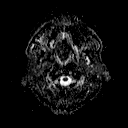
[im 25/50]
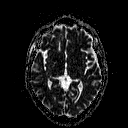
[im 50/50]
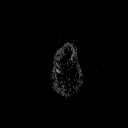

[Series 5: DWI · coronal · 5.0mm · 1.80mm/px · 5 of 77 slices shown (3 of 4)]
[im 1/77]
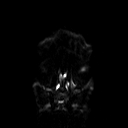
[im 20/77]
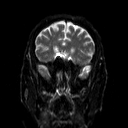
[im 39/77]
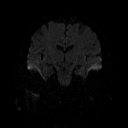
[im 58/77]
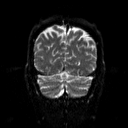
[im 77/77]
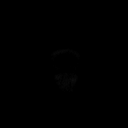

[Series 6: DWI · coronal · 5.0mm · 1.80mm/px · 2 of 39 slices shown (4 of 4)]
[im 1/39]
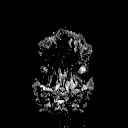
[im 39/39]
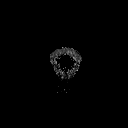

[Series 7: T2 · axial · 5.0mm · 0.75mm/px · z∈[-63,+99]mm · 2 of 25 slices shown (1 of 2)]
[im 1/25]
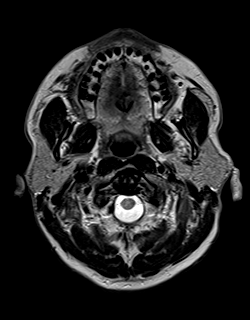
[im 25/25]
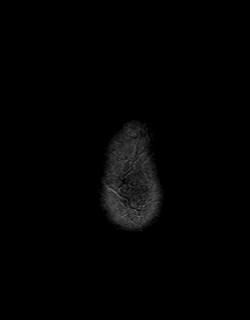

[Series 8: FLAIR · axial · 3.0mm · 0.47mm/px · z∈[-59,+94]mm · 2 of 34 slices shown]
[im 1/34]
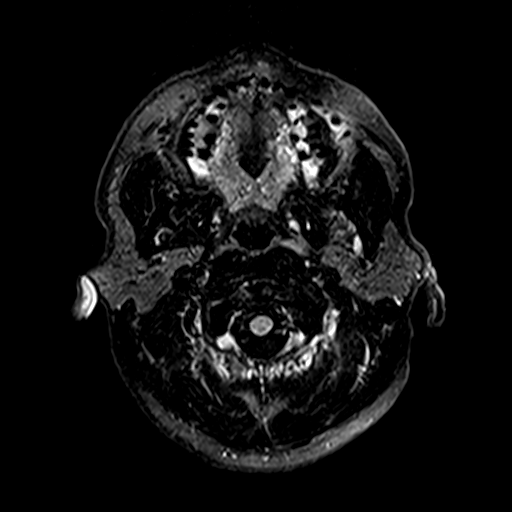
[im 34/34]
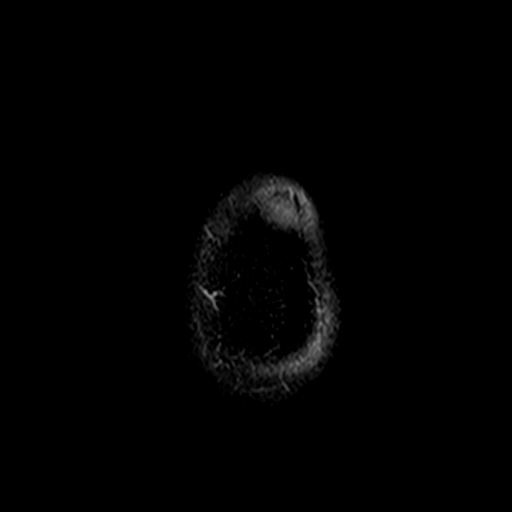

[Series 10: swi_images · axial · 4.0mm · 0.94mm/px · z∈[-60,+96]mm · 2 of 40 slices shown]
[im 1/40]
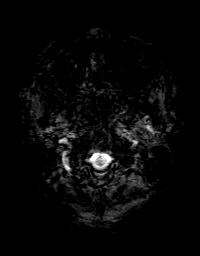
[im 40/40]
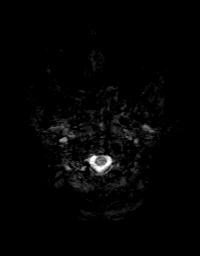

[Series 11: t1_mpr_tra · axial · 1.0mm · 0.75mm/px · z∈[-62,+97]mm · 10 of 160 slices shown]
[im 1/160]
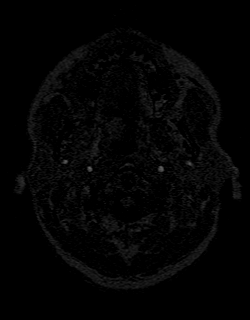
[im 18/160]
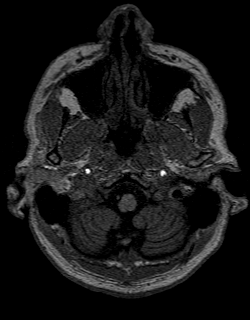
[im 36/160]
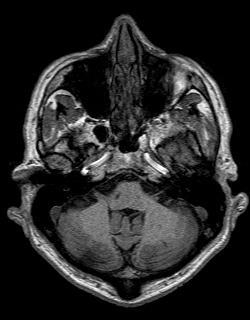
[im 54/160]
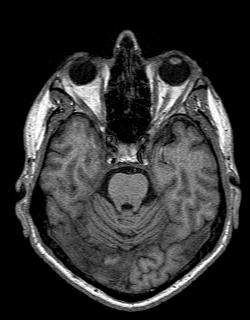
[im 71/160]
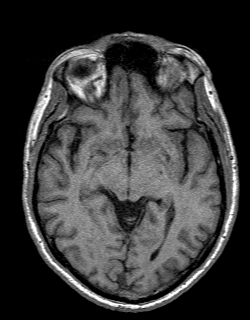
[im 89/160]
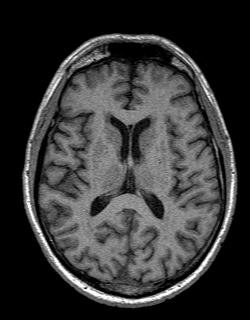
[im 107/160]
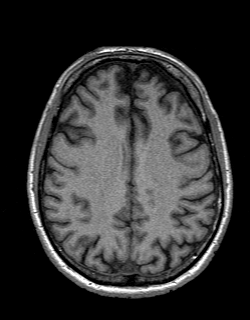
[im 124/160]
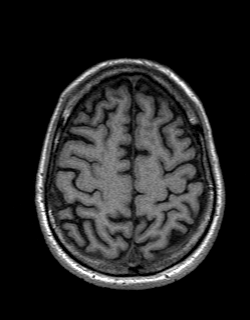
[im 142/160]
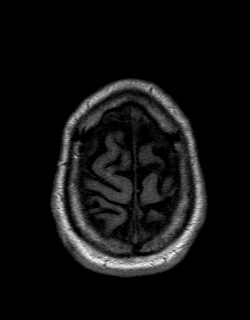
[im 160/160]
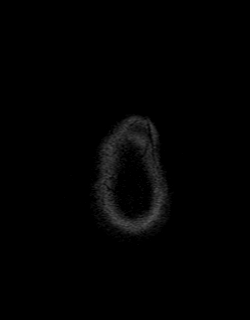

[Series 12: T2 · coronal · 5.0mm · 0.45mm/px · 2 of 29 slices shown (2 of 2)]
[im 1/29]
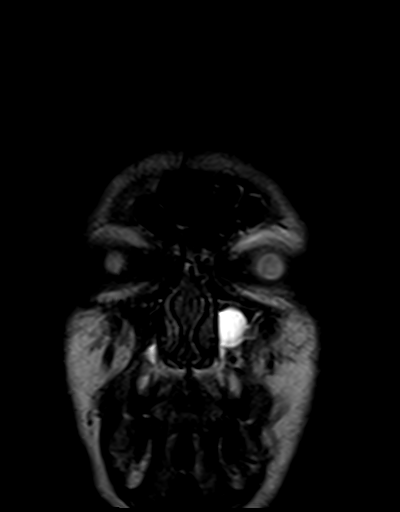
[im 29/29]
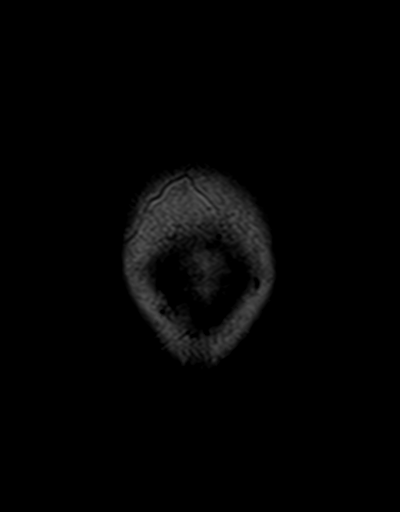

[Series 13: t1_mpr_tra post · axial · 1.0mm · 0.75mm/px · z∈[-62,+97]mm · 10 of 160 slices shown]
[im 1/160]
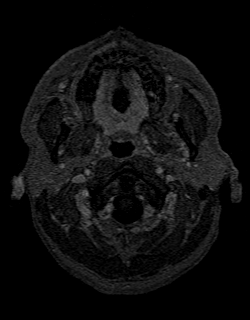
[im 18/160]
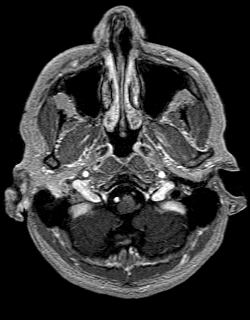
[im 36/160]
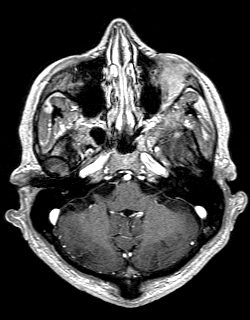
[im 54/160]
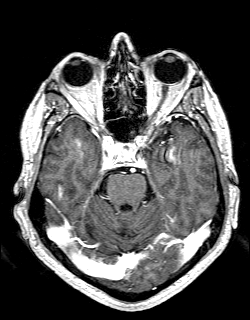
[im 71/160]
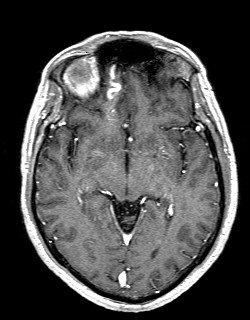
[im 89/160]
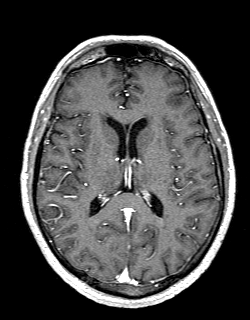
[im 107/160]
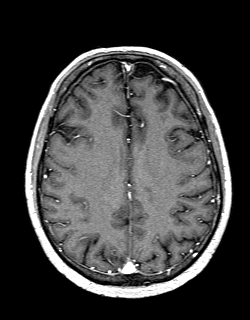
[im 124/160]
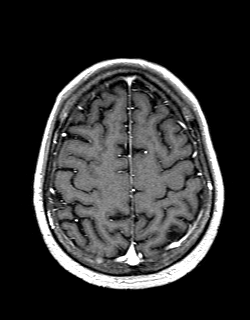
[im 142/160]
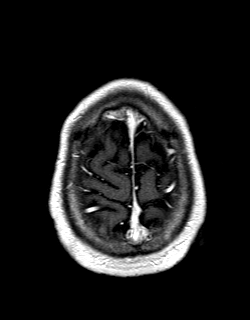
[im 160/160]
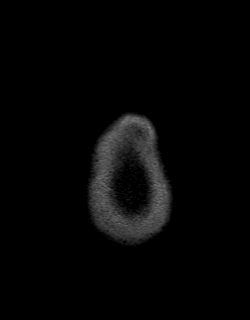

[Series 14: post cor · coronal · 5.0mm · 0.45mm/px · 2 of 29 slices shown]
[im 1/29]
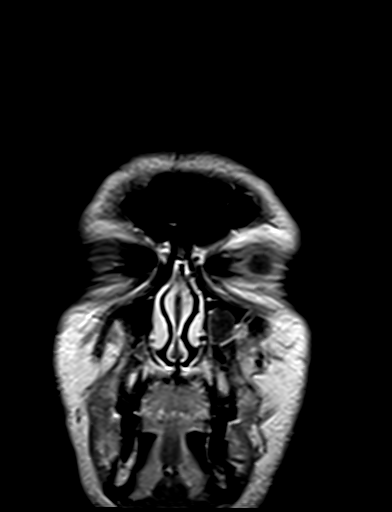
[im 29/29]
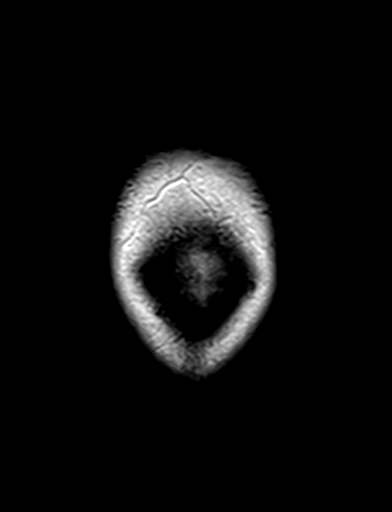

[48 of 48 positions shown; findings below may reference images not displayed]

FINDINGS: Brain: There is no evidence of an acute infarct, mass, midline
shift, or extra-axial fluid collection. T2 hyperintensities
scattered throughout the cerebral white matter bilaterally are
nonspecific but compatible with mild-to-moderate chronic small
vessel ischemic disease. There are small chronic infarcts in the
left cerebellar hemisphere and left thalamus. The ventricles are
normal in size.

There is a large vein anteriorly in the right frontal lobe which
courses from the frontal horn of the right lateral ventricle
anteriorly to the convexity and is contiguous with a cortical vein
which ultimately drains into the superior sagittal sinus. There are
multiple associated small spider-like medullary veins, and the
appearance is most consistent with a developmental venous anomaly.
No associated nidus or enlarged arteries are seen to suggest the
presence of an AVM/high-flow vascular malformation. There is minimal
gliosis in the surrounding right frontal brain parenchyma.

Vascular: Major intracranial vascular flow voids are preserved.

Skull and upper cervical spine: Unremarkable bone marrow signal.

Sinuses/Orbits: Unremarkable orbits. Mild mucosal thickening in the
paranasal sinuses. Clear mastoid air cells.

Other: Small frontal scalp lipoma.
IMPRESSION: 1. No acute intracranial abnormality.
2. Mild-to-moderate chronic small vessel ischemic disease.
3. Chronic left cerebellar and left thalamic infarcts.
4. Developmental venous anomaly in the right frontal lobe.

## 2022-04-29 ENCOUNTER — Encounter: Payer: Self-pay | Admitting: Family Medicine

## 2022-04-29 ENCOUNTER — Ambulatory Visit (INDEPENDENT_AMBULATORY_CARE_PROVIDER_SITE_OTHER): Payer: 59 | Admitting: Family Medicine

## 2022-04-29 VITALS — BP 122/76 | HR 67 | Temp 98.9°F | Ht 71.0 in | Wt 172.8 lb

## 2022-04-29 DIAGNOSIS — Z0001 Encounter for general adult medical examination with abnormal findings: Secondary | ICD-10-CM

## 2022-04-29 DIAGNOSIS — E7800 Pure hypercholesterolemia, unspecified: Secondary | ICD-10-CM | POA: Diagnosis not present

## 2022-04-29 DIAGNOSIS — F17200 Nicotine dependence, unspecified, uncomplicated: Secondary | ICD-10-CM

## 2022-04-29 DIAGNOSIS — Z125 Encounter for screening for malignant neoplasm of prostate: Secondary | ICD-10-CM

## 2022-04-29 DIAGNOSIS — Z1211 Encounter for screening for malignant neoplasm of colon: Secondary | ICD-10-CM

## 2022-04-29 DIAGNOSIS — Z0000 Encounter for general adult medical examination without abnormal findings: Secondary | ICD-10-CM

## 2022-04-29 DIAGNOSIS — Z8673 Personal history of transient ischemic attack (TIA), and cerebral infarction without residual deficits: Secondary | ICD-10-CM | POA: Diagnosis not present

## 2022-04-29 MED ORDER — AZITHROMYCIN 250 MG PO TABS: ORAL_TABLET | ORAL | 0 refills | Status: DC

## 2022-04-29 MED ORDER — ROSUVASTATIN CALCIUM 20 MG PO TABS: 20.0000 mg | ORAL_TABLET | Freq: Every day | ORAL | 3 refills | Status: DC

## 2022-04-29 NOTE — Progress Notes (Signed)
Subjective:    Patient ID: Kyle Lozano, male    DOB: December 02, 1960, 62 y.o.   MRN: [id number]  HPI  FINDINGS: Brain: There is no evidence of an acute infarct, mass, midline shift, or extra-axial fluid collection. T2 hyperintensities scattered throughout the cerebral white matter bilaterally are nonspecific but compatible with mild-to-moderate chronic small vessel ischemic disease. There are small chronic infarcts in the left cerebellar hemisphere and left thalamus. The ventricles are normal in size.   There is a large vein anteriorly in the right frontal lobe which courses from the frontal horn of the right lateral ventricle anteriorly to the convexity and is contiguous with a cortical vein which ultimately drains into the superior sagittal sinus. 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 No associated nidus or enlarged arteries are seen to suggest the presence of an AVM/high-flow vascular malformation. There is minimal gliosis in the surrounding right frontal brain parenchyma.   Vascular: Major intracranial vascular flow voids are preserved.   Skull and upper cervical spine: Unremarkable bone marrow signal.   Sinuses/Orbits: Unremarkable orbits. Mild mucosal thickening in the paranasal sinuses. Clear mastoid air cells. 03/2021 Above is the findings of the MRI that was recently performed.  Patient saw my partner for vertigo and blurry vision.  She ordered an MRI after the evaluation.  On the MRI, the patient was found to have chronic small strokes in the left cerebellum, left thalamus, and chronic microvascular disease throughout.  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  However there was no areas of AVM or high flow vascular malformation.  Patient is here today to discuss the results.  He is still smoking.  He does have prediabetes.  However  his blood pressure is well controlled at 128/72.  He is on an aspirin.  And his LDL cholesterol was last found to be 54 well controlled.  At that time, my plan was: The location of the 2 strokes on the left side of the brain, 1 in the left cerebellum and one in the left thalamus makes me concerned about embolic stroke due to plaque rupture from my more proximal source.  Therefore I will obtain an MRA of the head and neck to evaluate for potential sources in an effort to help reduce the risk of a future stroke.  Patient is already on an aspirin, his LDL cholesterol is well below 70, his blood pressure is well controlled.  Therefore his biggest uncontrolled risk factor is smoking with encouraged him to quit smoking.  Await results of MRA  04/29/22 MRA of the head and neck were normal in 3/23.  Patient is still taking his aspirin.  Unfortunately he stopped taking his cholesterol medicine.  He has a history of stroke.  I explained to him that I like him to stay on a statin indefinitely unless it causes him side effects.  He is willing to take a statin again.  He denies any side effects previously.  Unfortunately he continues to smoke.  He is due cancer screening.  His last colonoscopy was in 2017.  They found to, tubular adenomas.  They recommended a repeat colonoscopy in 5 years.  Therefore this is due.  He is also due for prostate cancer screening.  I reviewed his immunizations.  He is due for shingles shot.  He is due for a COVID booster.  His next tetanus shot is  not due until 2027.  He has already had the RSV vaccine  Past Medical History:  Diagnosis Date   Allergy    seasonal w/ migraines   HLD (hyperlipidemia)    Stroke ([place])    Tobacco use      Past Surgical History:  Procedure Laterality Date   COLONOSCOPY     POLYPECTOMY     WISDOM TOOTH EXTRACTION     Current Outpatient Medications on File Prior to Visit  Medication Sig Dispense Refill   aspirin 81 MG tablet Take 81 mg by mouth daily.      meclizine (ANTIVERT) 25 MG tablet Take 1 tablet (25 mg total) by mouth 3 (three) times daily as needed for dizziness. 30 tablet 0   Omega-3 Fatty Acids (FISH OIL) 1000 MG CAPS Take 1 capsule by mouth daily.     sildenafil (VIAGRA) 100 MG tablet Take 0.5-1 tablets (50-100 mg total) by mouth daily as needed for erectile dysfunction. 3 tablet 11   No current facility-administered medications on file prior to visit.   No Known Allergies Social History   Socioeconomic History   Marital status: Married    Spouse name: Not on file   Number of children: Not on file   Years of education: Not on file   Highest education level: Not on file  Occupational History   Not on file  Tobacco Use   Smoking status: Some Days    Packs/day: 0.50    Types: Cigarettes    Start date: 02/18/1979   Smokeless tobacco: Never  Substance and Sexual Activity   Alcohol use: Yes    Alcohol/week: 18.0 standard drinks of alcohol    Types: 18 Cans of beer per week   Drug use: No   Sexual activity: Not on file  Other Topics Concern   Not on file  Social History Narrative   He works doing Occupational psychologist   Says his job involves a lot of moving around/ physical activity      Says he only smokes when he drinks on the weekends.   Only drinks on the weekend. Only drinks beer. No other liquor or wine etc   Social Determinants of Health   Financial Resource Strain: Not on file  Food Insecurity: Not on file  Transportation Needs: Not on file  Physical Activity: Not on file  Stress: Not on file  Social Connections: Not on file  Intimate Partner Violence: Not on file   Family History  Problem Relation Age of Onset   Heart disease Father 84       MI @ 20. No other hx   Colon cancer Neg Hx    Colon polyps Neg Hx    Esophageal cancer Neg Hx    Rectal cancer Neg Hx    Stomach cancer Neg Hx       Review of Systems  All other systems reviewed and are negative.      Objective:   Physical  Exam Vitals reviewed.  Constitutional:      General: He is not in acute distress.    Appearance: Normal appearance. He is normal weight. He is not ill-appearing, toxic-appearing or diaphoretic.  HENT:     Head: Normocephalic and atraumatic.     Right Ear: Tympanic membrane and ear canal normal. There is no impacted cerumen.     Left Ear: Tympanic membrane and ear canal normal. There is no impacted cerumen.     Nose: Nose normal. No congestion or  rhinorrhea.     Mouth/Throat:     Mouth: Mucous membranes are moist.     Pharynx: Oropharynx is clear. No oropharyngeal exudate or posterior oropharyngeal erythema.  Eyes:     General: No scleral icterus.       Right eye: No discharge.        Left eye: No discharge.     Extraocular Movements: Extraocular movements intact.     Conjunctiva/sclera: Conjunctivae normal.     Pupils: Pupils are equal, round, and reactive to light.  Neck:     Vascular: No carotid bruit.  Cardiovascular:     Rate and Rhythm: Normal rate and regular rhythm.     Pulses: Normal pulses.     Heart sounds: Normal heart sounds. No murmur heard.    No friction rub. No gallop.  Pulmonary:     Effort: Pulmonary effort is normal. No respiratory distress.     Breath sounds: Normal breath sounds. No stridor. No wheezing, rhonchi or rales.  Chest:     Chest wall: No tenderness.  Abdominal:     General: Abdomen is flat. Bowel sounds are normal. There is no distension.     Palpations: Abdomen is soft. There is no mass.     Tenderness: There is no abdominal tenderness. There is no right CVA tenderness, left CVA tenderness, guarding or rebound.     Hernia: No hernia is present.  Musculoskeletal:        General: No swelling, tenderness, deformity or signs of injury. Normal range of motion.     Cervical back: Neck supple. No rigidity. No muscular tenderness.     Right lower leg: No edema.     Left lower leg: No edema.  Lymphadenopathy:     Cervical: No cervical adenopathy.   Skin:    General: Skin is warm.     Capillary Refill: Capillary refill takes less than 2 seconds.     Coloration: Skin is not jaundiced or pale.     Findings: No bruising, erythema, lesion or rash.  Neurological:     General: No focal deficit present.     Mental Status: He is alert and oriented to person, place, and time. Mental status is at baseline.     Cranial Nerves: No cranial nerve deficit.     Sensory: No sensory deficit.     Motor: No weakness.     Coordination: Coordination normal.     Gait: Gait normal.     Deep Tendon Reflexes: Reflexes normal.           Assessment & Plan:  Pure hypercholesterolemia - Plan: CBC with Differential/Platelet, COMPLETE METABOLIC PANEL WITH GFR, Lipid panel  Prostate cancer screening - Plan: PSA  Colon cancer screening - Plan: Ambulatory referral to Gastroenterology  Smoker - Plan: CT CHEST LUNG CA SCREEN LOW DOSE W/O CM  General medical exam  History of CVA (cerebrovascular accident) Given his history of stroke, I encouraged the patient to take aspirin 81 mg indefinitely along with Crestor 20 mg a day indefinitely.  Encourage smoking cessation.  Screen for lung cancer with a CT scan of the chest.  Consult GI for colonoscopy.  Screen for prostate cancer with PSA.  Check CBC, CMP, fasting lipid panel.  Recommended the shingles vaccine.  Recommended COVID booster.

## 2022-04-30 LAB — LIPID PANEL
Cholesterol: 233 mg/dL — ABNORMAL HIGH (ref <200)
HDL: 69 mg/dL (ref >=40)
LDL Cholesterol (Calc): 145 mg/dL (calc) — ABNORMAL HIGH
Non-HDL Cholesterol (Calc): 164 mg/dL (calc) — ABNORMAL HIGH (ref <130)
Total CHOL/HDL Ratio: 3.4 (calc) (ref <5.0)
Triglycerides: 84 mg/dL (ref <150)

## 2022-04-30 LAB — CBC WITH DIFFERENTIAL/PLATELET
Absolute Monocytes: 498 cells/uL (ref 200–950)
Basophils Absolute: 30 cells/uL (ref 0–200)
Basophils Relative: 0.5 %
Eosinophils Absolute: 168 cells/uL (ref 15–500)
Eosinophils Relative: 2.8 %
HCT: 48.1 % (ref 38.5–50.0)
Hemoglobin: 16.9 g/dL (ref 13.2–17.1)
Lymphs Abs: 1788 cells/uL (ref 850–3900)
MCH: 32.2 pg (ref 27.0–33.0)
MCHC: 35.1 g/dL (ref 32.0–36.0)
MCV: 91.6 fL (ref 80.0–100.0)
MPV: 9.7 fL (ref 7.5–12.5)
Monocytes Relative: 8.3 %
Neutro Abs: 3516 cells/uL (ref 1500–7800)
Neutrophils Relative %: 58.6 %
Platelets: 213 10*3/uL (ref 140–400)
RBC: 5.25 10*6/uL (ref 4.20–5.80)
RDW: 12.4 % (ref 11.0–15.0)
Total Lymphocyte: 29.8 %
WBC: 6 10*3/uL (ref 3.8–10.8)

## 2022-04-30 LAB — COMPLETE METABOLIC PANEL WITH GFR
AG Ratio: 2 (calc) (ref 1.0–2.5)
ALT: 24 U/L (ref 9–46)
AST: 21 U/L (ref 10–35)
Albumin: 4.7 g/dL (ref 3.6–5.1)
Alkaline phosphatase (APISO): 72 U/L (ref 35–144)
BUN: 13 mg/dL (ref 7–25)
CO2: 23 mmol/L (ref 20–32)
Calcium: 9.1 mg/dL (ref 8.6–10.3)
Chloride: 105 mmol/L (ref 98–110)
Creat: 0.95 mg/dL (ref 0.70–1.35)
Globulin: 2.3 g/dL (calc) (ref 1.9–3.7)
Glucose, Bld: 127 mg/dL — ABNORMAL HIGH (ref 65–99)
Potassium: 3.9 mmol/L (ref 3.5–5.3)
Sodium: 139 mmol/L (ref 135–146)
Total Bilirubin: 0.7 mg/dL (ref 0.2–1.2)
Total Protein: 7 g/dL (ref 6.1–8.1)
eGFR: 90 mL/min/{1.73_m2} (ref >=60)

## 2022-04-30 LAB — PSA: PSA: 1.93 ng/mL (ref <=4.00)

## 2022-05-02 ENCOUNTER — Other Ambulatory Visit: Payer: Self-pay

## 2022-05-02 DIAGNOSIS — E7800 Pure hypercholesterolemia, unspecified: Secondary | ICD-10-CM

## 2022-05-02 DIAGNOSIS — Z1211 Encounter for screening for malignant neoplasm of colon: Secondary | ICD-10-CM

## 2022-05-02 DIAGNOSIS — K649 Unspecified hemorrhoids: Secondary | ICD-10-CM

## 2022-05-29 ENCOUNTER — Ambulatory Visit: Payer: 59 | Admitting: Family Medicine

## 2022-05-29 ENCOUNTER — Encounter: Payer: Self-pay | Admitting: Family Medicine

## 2022-05-29 VITALS — BP 130/82 | HR 87 | Temp 98.7°F | Ht 71.0 in | Wt 173.8 lb

## 2022-05-29 DIAGNOSIS — Z1211 Encounter for screening for malignant neoplasm of colon: Secondary | ICD-10-CM | POA: Diagnosis not present

## 2022-05-29 DIAGNOSIS — H60331 Swimmer's ear, right ear: Secondary | ICD-10-CM

## 2022-05-29 MED ORDER — NEOMYCIN-POLYMYXIN-HC 3.5-10000-1 OT SOLN: 3.0000 [drp] | Freq: Four times a day (QID) | OTIC | 0 refills | Status: DC

## 2022-05-29 NOTE — Progress Notes (Signed)
Subjective:    Patient ID: Kyle Lozano, male    DOB: [birth year], 62 y.o.   MRN: [id number]  HPI Patient reports decreased hearing in his right ear.  He states it feels like his ear stopped up.  He denies any pain however he feels like his ear is popping occasionally.  On examination, the auditory canal is swollen and covered with exudate.  There is erythema throughout the auditory canal.  White exudate is also covering the surface of the tympanic membrane.  There appears to be a small hematoma on the wall of the canal roughly at 9:00 that appears to be a source of trauma Past Medical History:  Diagnosis Date   Allergy    seasonal w/ migraines   HLD (hyperlipidemia)    Stroke    Tobacco use      Past Surgical History:  Procedure Laterality Date   COLONOSCOPY     POLYPECTOMY     WISDOM TOOTH EXTRACTION     Current Outpatient Medications on File Prior to Visit  Medication Sig Dispense Refill   aspirin 81 MG tablet Take 81 mg by mouth daily.     meclizine (ANTIVERT) 25 MG tablet Take 1 tablet (25 mg total) by mouth 3 (three) times daily as needed for dizziness. 30 tablet 0   Omega-3 Fatty Acids (FISH OIL) 1000 MG CAPS Take 1 capsule by mouth daily.     rosuvastatin (CRESTOR) 20 MG tablet Take 1 tablet (20 mg total) by mouth daily. 90 tablet 3   sildenafil (VIAGRA) 100 MG tablet Take 0.5-1 tablets (50-100 mg total) by mouth daily as needed for erectile dysfunction. 3 tablet 11   vitamin E 180 MG (400 UNITS) capsule Take 400 Units by mouth daily.     No current facility-administered medications on file prior to visit.   No Known Allergies Social History   Socioeconomic History   Marital status: Married    Spouse name: Not on file   Number of children: Not on file   Years of education: Not on file   Highest education level: Not on file  Occupational History   Not on file  Tobacco Use   Smoking status: Some Days    Packs/day: .5    Types: Cigarettes    Start date:  02/18/1979   Smokeless tobacco: Never  Substance and Sexual Activity   Alcohol use: Yes    Alcohol/week: 18.0 standard drinks of alcohol    Types: 18 Cans of beer per week   Drug use: No   Sexual activity: Not on file  Other Topics Concern   Not on file  Social History Narrative   He works doing Furniture conservator/restorer   Says his job involves a lot of moving around/ physical activity      Says he only smokes when he drinks on the weekends.   Only drinks on the weekend. Only drinks beer. No other liquor or wine etc   Social Determinants of Health   Financial Resource Strain: Not on file  Food Insecurity: Not on file  Transportation Needs: Not on file  Physical Activity: Not on file  Stress: Not on file  Social Connections: Not on file  Intimate Partner Violence: Not on file   Family History  Problem Relation Age of Onset   Heart disease Father 63       MI @ 32. No other hx   Colon cancer Neg Hx    Colon polyps Neg Hx  Esophageal cancer Neg Hx    Rectal cancer Neg Hx    Stomach cancer Neg Hx       Review of Systems  All other systems reviewed and are negative.      Objective:   Physical Exam Vitals reviewed.  Constitutional:      General: He is not in acute distress.    Appearance: Normal appearance. He is normal weight. He is not ill-appearing, toxic-appearing or diaphoretic.  HENT:     Head: Normocephalic and atraumatic.     Right Ear: Drainage, swelling and tenderness present. There is no impacted cerumen. Tympanic membrane is not erythematous or retracted.     Left Ear: Tympanic membrane and ear canal normal. There is no impacted cerumen.     Nose: Nose normal. No congestion or rhinorrhea.     Mouth/Throat:     Mouth: Mucous membranes are moist.     Pharynx: Oropharynx is clear. No oropharyngeal exudate or posterior oropharyngeal erythema.  Eyes:     General: No scleral icterus.       Right eye: No discharge.        Left eye: No discharge.      Extraocular Movements: Extraocular movements intact.     Conjunctiva/sclera: Conjunctivae normal.     Pupils: Pupils are equal, round, and reactive to light.  Neck:     Vascular: No carotid bruit.  Cardiovascular:     Rate and Rhythm: Normal rate and regular rhythm.     Pulses: Normal pulses.     Heart sounds: Normal heart sounds. No murmur heard.    No friction rub. No gallop.  Pulmonary:     Effort: Pulmonary effort is normal. No respiratory distress.     Breath sounds: Normal breath sounds. No stridor. No wheezing, rhonchi or rales.  Chest:     Chest wall: No tenderness.  Abdominal:     General: Abdomen is flat. Bowel sounds are normal. There is no distension.     Palpations: Abdomen is soft. There is no mass.     Tenderness: There is no abdominal tenderness. There is no right CVA tenderness, left CVA tenderness, guarding or rebound.     Hernia: No hernia is present.  Musculoskeletal:        General: No swelling, tenderness, deformity or signs of injury. Normal range of motion.     Cervical back: Neck supple. No rigidity. No muscular tenderness.     Right lower leg: No edema.     Left lower leg: No edema.  Lymphadenopathy:     Cervical: No cervical adenopathy.  Skin:    General: Skin is warm.     Capillary Refill: Capillary refill takes less than 2 seconds.     Coloration: Skin is not jaundiced or pale.     Findings: No bruising, erythema, lesion or rash.  Neurological:     General: No focal deficit present.     Mental Status: He is alert and oriented to person, place, and time. Mental status is at baseline.     Cranial Nerves: No cranial nerve deficit.     Sensory: No sensory deficit.     Motor: No weakness.     Coordination: Coordination normal.     Gait: Gait normal.     Deep Tendon Reflexes: Reflexes normal.           Assessment & Plan:  Colon cancer screening - Plan: Ambulatory referral to Gastroenterology  Acute swimmer's ear of right side Patient is  due  for a colonoscopy.  I will order a GI consult again.  Patient also has swimmer's ear in his right ear.  Treat that with Cortisporin HC otic 4 drops 4 times daily for 7 days.

## 2022-08-20 ENCOUNTER — Ambulatory Visit: Payer: 59 | Admitting: Gastroenterology

## 2022-08-20 ENCOUNTER — Encounter: Payer: Self-pay | Admitting: Gastroenterology

## 2022-08-20 VITALS — BP 116/74 | HR 66 | Ht 71.0 in | Wt 172.5 lb

## 2022-08-20 DIAGNOSIS — K644 Residual hemorrhoidal skin tags: Secondary | ICD-10-CM

## 2022-08-20 DIAGNOSIS — K649 Unspecified hemorrhoids: Secondary | ICD-10-CM

## 2022-08-20 DIAGNOSIS — Z86010 Personal history of colonic polyps: Secondary | ICD-10-CM | POA: Diagnosis not present

## 2022-08-20 MED ORDER — NA SULFATE-K SULFATE-MG SULF 17.5-3.13-1.6 GM/177ML PO SOLN: 1.0000 | Freq: Once | ORAL | 0 refills | Status: AC

## 2022-08-20 NOTE — Progress Notes (Signed)
Chief Complaint:    Symptomatic hemorrhoids, colon polyp surveillance  HPI:     Patient is a 62 y.o. male with a history of HLD, CVA (on ASA 81 mg), presenting [facility] for evaluation for ongoing colon polyp surveillance.  Separately, has the feeling of abnormal tissue in the anorectal area, described as 1 hard and 1 soft spot. Feels it is difficult to clean self. No bleeding. No itching, irritation. Sxs have been present for a few years.   Endoscopic History - 2012: Colonoscopy: 2 subcentimeter adenomas - 09/28/2015: Colonoscopy: 2 subcentimeter cecal adenomas, Otherwise normal.  Repeat in 5 years      Latest Ref Rng & Units 04/29/2022    3:27 PM 12/13/2020    8:53 AM 12/12/2019    9:59 AM  CBC  WBC 3.8 - 10.8 Thousand/uL 6.0  7.3  4.5   Hemoglobin 13.2 - 17.1 g/dL 40.9  81.1  91.4   Hematocrit 38.5 - 50.0 % 48.1  48.5  48.8   Platelets 140 - 400 Thousand/uL 213  181  210       Latest Ref Rng & Units 04/29/2022    3:27 PM 12/13/2020    8:53 AM 05/21/2020   10:39 AM  CMP  Glucose 65 - 99 mg/dL 782  956  213   BUN 7 - 25 mg/dL 13  14  11    Creatinine 0.70 - 1.35 mg/dL 0.86  5.78  4.69   Sodium 135 - 146 mmol/L 139  140  143   Potassium 3.5 - 5.3 mmol/L 3.9  4.2  4.3   Chloride 98 - 110 mmol/L 105  106  108   CO2 20 - 32 mmol/L 23  25  22    Calcium 8.6 - 10.3 mg/dL 9.1  9.2  9.4   Total Protein 6.1 - 8.1 g/dL 7.0  6.5  6.7   Total Bilirubin 0.2 - 1.2 mg/dL 0.7  0.9  0.6   AST 10 - 35 U/L 21  36  28   ALT 9 - 46 U/L 24  51  44      Review of systems:     No chest pain, no SOB, no fevers, no urinary sx   Past Medical History:  Diagnosis Date   Allergy    seasonal w/ migraines   HLD (hyperlipidemia)    Stroke ([facility])    Tobacco use     Patient's surgical history, family medical history, social history, medications and allergies were all reviewed in Epic    Current Outpatient Medications  Medication Sig Dispense Refill   aspirin 81 MG tablet  Take 81 mg by mouth daily.     meclizine (ANTIVERT) 25 MG tablet Take 1 tablet (25 mg total) by mouth 3 (three) times daily as needed for dizziness. (Patient not taking: Reported on 08/20/2022) 30 tablet 0   neomycin-polymyxin-hydrocortisone (CORTISPORIN) OTIC solution Place 3 drops into the right ear 4 (four) times daily. (Patient not taking: Reported on 08/20/2022) 10 mL 0   Omega-3 Fatty Acids (FISH OIL) 1000 MG CAPS Take 1 capsule by mouth daily.     rosuvastatin (CRESTOR) 20 MG tablet Take 1 tablet (20 mg total) by mouth daily. 90 tablet 3   sildenafil (VIAGRA) 100 MG tablet Take 0.5-1 tablets (50-100 mg total) by mouth daily as needed for erectile dysfunction. (Patient not taking: Reported on 08/20/2022) 3 tablet 11   vitamin E 180 MG (400 UNITS) capsule Take 400 Units by mouth daily.  No current facility-administered medications for this visit.    Physical Exam:     BP 116/74   Pulse 66   Ht 5\' 11"  (1.803 m)   Wt 172 lb 8 oz (78.2 kg)   BMI 24.06 kg/m   GENERAL:  Pleasant male in NAD PSYCH: : Cooperative, normal affect Musculoskeletal:  Normal muscle tone, normal strength NEURO: Alert and oriented x 3, no focal neurologic deficits Rectal exam: Sensation intact and preserved anal wink. No external anal fissures.  A few external skin tags, and prolapsing internal hemorrhoids. (Chaperone: Ailene Rud, CMA).     IMPRESSION and PLAN:    1) History of colon polyps - Repeat colonoscopy for ongoing polyp surveillance  2) Symptomatic hemorrhoids 3) Symptomatic skin tags - Will further evaluate grade/severity of hemorrhoids at time of colonoscopy as above - Referral to Colorectal Surgery  4) History of CVA - Ok to resume ASA 81 mg in the perioperative period  The indications, risks, and benefits of colonoscopy were explained to the patient in detail. Risks include but are not limited to bleeding, perforation, adverse reaction to medications, and cardiopulmonary compromise. Sequelae  include but are not limited to the possibility of surgery, hospitalization, and mortality. The patient verbalized understanding and wished to proceed. All questions answered, referred to the scheduler and bowel prep ordered. Further recommendations pending results of the exam.           Verlin Dike Adoni Greenough ,DO, FACG 08/20/2022, 10:09 AM

## 2022-08-20 NOTE — Patient Instructions (Signed)
You have been scheduled for a colonoscopy. Please follow written instructions given to you at your visit today.   Please pick up your prep supplies at the pharmacy within the next 1-3 days.  If you use inhalers (even only as needed), please bring them with you on the day of your procedure.  DO NOT TAKE 7 DAYS PRIOR TO TEST- Trulicity (dulaglutide) Ozempic, Wegovy (semaglutide) Mounjaro (tirzepatide) Bydureon Bcise (exanatide extended release)  DO NOT TAKE 1 DAY PRIOR TO YOUR TEST Rybelsus (semaglutide) Adlyxin (lixisenatide) Victoza (liraglutide) Byetta (exanatide) ___________________________________________________________________________ Bonita Quin will be contacted by [facility] to schedule a consult.  [facility] is located at [address], [address].   Phone No# [phone].  Due to recent changes in healthcare laws, you may see the results of your imaging and laboratory studies on MyChart before your provider has had a chance to review them.  We understand that in some cases there may be results that are confusing or concerning to you. Not all laboratory results come back in the same time frame and the provider may be waiting for multiple results in order to interpret others.  Please give [place] 48 hours in order for your provider to thoroughly review all the results before contacting the office for clarification of your results.    Thank you for choosing me and  Gastroenterology.  Vito Cirigliano, D.O.

## 2022-09-26 ENCOUNTER — Encounter: Payer: Self-pay | Admitting: Gastroenterology

## 2022-10-08 ENCOUNTER — Ambulatory Visit (AMBULATORY_SURGERY_CENTER): Payer: 59 | Admitting: Gastroenterology

## 2022-10-08 ENCOUNTER — Encounter: Payer: Self-pay | Admitting: Gastroenterology

## 2022-10-08 VITALS — BP 95/63 | HR 96 | Temp 98.2°F | Resp 19 | Ht 71.0 in | Wt 172.0 lb

## 2022-10-08 DIAGNOSIS — K5730 Diverticulosis of large intestine without perforation or abscess without bleeding: Secondary | ICD-10-CM

## 2022-10-08 DIAGNOSIS — D123 Benign neoplasm of transverse colon: Secondary | ICD-10-CM | POA: Diagnosis not present

## 2022-10-08 DIAGNOSIS — Z09 Encounter for follow-up examination after completed treatment for conditions other than malignant neoplasm: Secondary | ICD-10-CM

## 2022-10-08 DIAGNOSIS — K648 Other hemorrhoids: Secondary | ICD-10-CM

## 2022-10-08 DIAGNOSIS — Z86010 Personal history of colonic polyps: Secondary | ICD-10-CM

## 2022-10-08 MED ORDER — SODIUM CHLORIDE 0.9 % IV SOLN: 500.0000 mL | INTRAVENOUS | Status: DC

## 2022-10-08 NOTE — Progress Notes (Signed)
Uneventful anesthetic. Report to pacu rn. Vss. Care resumed by rn. 

## 2022-10-08 NOTE — Patient Instructions (Addendum)
-  Resume previous diet. - Continue present medications. - Await pathology results. - Repeat colonoscopy for surveillance based on pathology results. - Return to GI office PRN.   YOU HAD AN ENDOSCOPIC PROCEDURE TODAY AT [facility]:   Refer to the procedure report that was given to you for any specific questions about what was found during the examination.  If the procedure report does not answer your questions, please call your gastroenterologist to clarify.  If you requested that your care partner not be given the details of your procedure findings, then the procedure report has been included in a sealed envelope for you to review at your convenience later.  YOU SHOULD EXPECT: Some feelings of bloating in the abdomen. Passage of more gas than usual.  Walking can help get rid of the air that was put into your GI tract during the procedure and reduce the bloating. If you had a lower endoscopy (such as a colonoscopy or flexible sigmoidoscopy) you may notice spotting of blood in your stool or on the toilet paper. If you underwent a bowel prep for your procedure, you may not have a normal bowel movement for a few days.  Please Note:  You might notice some irritation and congestion in your nose or some drainage.  This is from the oxygen used during your procedure.  There is no need for concern and it should clear up in a day or so.  SYMPTOMS TO REPORT IMMEDIATELY:  Following lower endoscopy (colonoscopy or flexible sigmoidoscopy):  Excessive amounts of blood in the stool  Significant tenderness or worsening of abdominal pains  Swelling of the abdomen that is new, acute  Fever of 100F or higher   For urgent or emergent issues, a gastroenterologist can be reached at any hour by calling [phone]. Do not use MyChart messaging for urgent concerns.    DIET:  We do recommend a small meal at first, but then you may proceed to your regular diet.  Drink plenty of fluids but you  should avoid alcoholic beverages for 24 hours.  ACTIVITY:  You should plan to take it easy for the rest of today and you should NOT DRIVE or use heavy machinery until tomorrow (because of the sedation medicines used during the test).    FOLLOW UP: Our staff will call the number listed on your records the next business day following your procedure.  We will call around 7:15- 8:00 am to check on you and address any questions or concerns that you may have regarding the information given to you following your procedure. If we do not reach you, we will leave a message.     If any biopsies were taken you will be contacted by phone or by letter within the next 1-3 weeks.  Please call us at [phone] if you have not heard about the biopsies in 3 weeks.    SIGNATURES/CONFIDENTIALITY: You and/or your care partner have signed paperwork which will be entered into your electronic medical record.  These signatures attest to the fact that that the information above on your After Visit Summary has been reviewed and is understood.  Full responsibility of the confidentiality of this discharge information lies with you and/or your care-partner.

## 2022-10-08 NOTE — Op Note (Signed)
[facility] Patient Name: Kyle Lozano Procedure Date: 10/08/2022 9:14 AM MRN: [id number] Endoscopist: Doristine Locks , MD, [id number] Age: 62 Referring MD:  Date of Birth: 1960-04-16 Gender: Male Account #: [id number] Procedure:                Colonoscopy Indications:              Surveillance: Personal history of adenomatous                            polyps on last colonoscopy >5 years ago                           ?" 2012: Colonoscopy: 2 subcentimeter adenomas                           ?" 09/28/2015: Colonoscopy: 2 subcentimeter cecal                            adenomas, Otherwise normal. Repeat in 5 years                           Separately, has been having symptomatic                            hemorrhoids. Has an appointment in the Col[facility]                            [facility] Clinic next week for evaluation. Medicines:                Monitored Anesthesia Care Procedure:                Pre-Anesthesia Assessment:                           - Prior to the procedure, a History and Physical                            was performed, and patient medications and                            allergies were reviewed. The patient's tolerance of                            previous anesthesia was also reviewed. The risks                            and benefits of the procedure and the sedation                            options and risks were discussed with the patient.                            All questions were answered, and informed consent  was obtained. Prior Anticoagulants: The patient has                            taken no anticoagulant or antiplatelet agents. ASA                            Grade Assessment: II - A patient with mild systemic                            disease. After reviewing the risks and benefits,                            the patient was deemed in satisfactory condition to                            undergo the  procedure.                           After obtaining informed consent, the colonoscope                            was passed under direct vision. Throughout the                            procedure, the patient's blood pressure, pulse, and                            oxygen saturations were monitored continuously. The                            CF [id number] #[id number] was introduced through the anus                            and advanced to the the terminal ileum. The                            colonoscopy was performed without difficulty. The                            patient tolerated the procedure well. The quality                            of the bowel preparation was good. The terminal                            ileum, ileocecal valve, appendiceal orifice, and                            rectum were photographed. Scope In: 9:23:40 AM Scope Out: 9:41:58 AM Scope Withdrawal Time: 0 hours 13 minutes 33 seconds  Total Procedure Duration: 0 hours 18 minutes 18 seconds  Findings:                 Hemorrhoids were found on perianal exam.  Three sessile polyps were found in the transverse                            colon. The polyps were 4 to 6 mm in size. These                            polyps were removed with a cold snare. Resection                            and retrieval were complete. Estimated blood loss                            was minimal.                           A few small-mouthed diverticula were found in the                            sigmoid colon.                           Non-bleeding external and internal hemorrhoids were                            found during retroflexion. The hemorrhoids were                            medium-sized.                           The terminal ileum appeared normal. Complications:            No immediate complications. Estimated Blood Loss:     Estimated blood loss was minimal. Impression:               - Hemorrhoids  found on perianal exam.                           - Three 4 to 6 mm polyps in the transverse colon,                            removed with a cold snare. Resected and retrieved.                           - Diverticulosis in the sigmoid colon.                           - Non-bleeding external and internal hemorrhoids.                           - The examined portion of the ileum was normal. Recommendation:           - Patient has a contact number available for                            emergencies. The signs  and symptoms of potential                            delayed complications were discussed with the                            patient. Return to normal activities tomorrow.                            Written discharge instructions were provided to the                            patient.                           - Resume previous diet.                           - Continue present medications.                           - Await pathology results.                           - Repeat colonoscopy for surveillance based on                            pathology results.                           - Return to GI office PRN. Doristine Locks, MD 10/08/2022 9:51:23 AM

## 2022-10-08 NOTE — Progress Notes (Signed)
Called to room to assist during endoscopic procedure.  Patient ID and intended procedure confirmed with present staff. Received instructions for my participation in the procedure from the performing physician.  

## 2022-10-08 NOTE — Progress Notes (Signed)
GASTROENTEROLOGY PROCEDURE H&P NOTE   Primary Care Physician: Donita Brooks, MD    Reason for Procedure:  Colon polyp surveillance  Plan:    Colonoscopy  Patient is appropriate for endoscopic procedure(s) in the ambulatory ([facility]) setting.  The nature of the procedure, as well as the risks, benefits, and alternatives were carefully and thoroughly reviewed with the patient. Ample time for discussion and questions allowed. The patient understood, was satisfied, and agreed to proceed.     HPI: Kyle Lozano is a 62 y.o. male who presents for colonoscopy for routine Colon Cancer screening and polyp surveillance.    Endoscopic History - 2012: Colonoscopy: 2 subcentimeter adenomas - 09/28/2015: Colonoscopy: 2 subcentimeter cecal adenomas, Otherwise normal.  Repeat in 5 years  Past Medical History:  Diagnosis Date   Allergy    seasonal w/ migraines   HLD (hyperlipidemia)    Stroke (HCC)    x2   Tobacco use     Past Surgical History:  Procedure Laterality Date   COLONOSCOPY     POLYPECTOMY     WISDOM TOOTH EXTRACTION      Prior to Admission medications   Medication Sig Start Date End Date Taking? Authorizing Provider  aspirin 81 MG tablet Take 81 mg by mouth daily.   Yes [provider]  Omega-3 Fatty Acids (FISH OIL) 1000 MG CAPS Take 1 capsule by mouth daily.   Yes [provider]  vitamin E 180 MG (400 UNITS) capsule Take 400 Units by mouth daily.   Yes [provider]  meclizine (ANTIVERT) 25 MG tablet Take 1 tablet (25 mg total) by mouth 3 (three) times daily as needed for dizziness. Patient not taking: Reported on 08/20/2022 03/06/21   Valentino Nose, NP  neomycin-polymyxin-hydrocortisone (CORTISPORIN) OTIC solution Place 3 drops into the right ear 4 (four) times daily. Patient not taking: Reported on 08/20/2022 05/29/22   Donita Brooks, MD  rosuvastatin (CRESTOR) 20 MG tablet Take 1 tablet (20 mg total) by mouth daily. 04/29/22    Donita Brooks, MD  sildenafil (VIAGRA) 100 MG tablet Take 0.5-1 tablets (50-100 mg total) by mouth daily as needed for erectile dysfunction. Patient not taking: Reported on 08/20/2022 12/16/18   Donita Brooks, MD    Current Outpatient Medications  Medication Sig Dispense Refill   aspirin 81 MG tablet Take 81 mg by mouth daily.     Omega-3 Fatty Acids (FISH OIL) 1000 MG CAPS Take 1 capsule by mouth daily.     vitamin E 180 MG (400 UNITS) capsule Take 400 Units by mouth daily.     meclizine (ANTIVERT) 25 MG tablet Take 1 tablet (25 mg total) by mouth 3 (three) times daily as needed for dizziness. (Patient not taking: Reported on 08/20/2022) 30 tablet 0   neomycin-polymyxin-hydrocortisone (CORTISPORIN) OTIC solution Place 3 drops into the right ear 4 (four) times daily. (Patient not taking: Reported on 08/20/2022) 10 mL 0   rosuvastatin (CRESTOR) 20 MG tablet Take 1 tablet (20 mg total) by mouth daily. 90 tablet 3   sildenafil (VIAGRA) 100 MG tablet Take 0.5-1 tablets (50-100 mg total) by mouth daily as needed for erectile dysfunction. (Patient not taking: Reported on 08/20/2022) 3 tablet 11   Current Facility-Administered Medications  Medication Dose Route Frequency Provider Last Rate Last Admin   0.9 %  sodium chloride infusion  500 mL Intravenous Continuous Wake Conlee V, DO        Allergies as of 10/08/2022   (No  Known Allergies)    Family History  Problem Relation Age of Onset   Heart disease Father 56       MI @ 76. No other hx   Colon cancer Neg Hx    Colon polyps Neg Hx    Esophageal cancer Neg Hx    Rectal cancer Neg Hx    Stomach cancer Neg Hx     Social History   Socioeconomic History   Marital status: Married    Spouse name: Not on file   Number of children: Not on file   Years of education: Not on file   Highest education level: Not on file  Occupational History   Not on file  Tobacco Use   Smoking status: Some Days    Current packs/day: 0.50    Average  packs/day: 0.5 packs/day for 43.6 years (21.8 ttl pk-yrs)    Types: Cigarettes    Start date: 02/18/1979   Smokeless tobacco: Never  Vaping Use   Vaping status: Never Used  Substance and Sexual Activity   Alcohol use: Yes    Alcohol/week: 18.0 standard drinks of alcohol    Types: 18 Cans of beer per week   Drug use: No   Sexual activity: Not on file  Other Topics Concern   Not on file  Social History Narrative   He works doing Furniture conservator/restorer   Says his job involves a lot of moving around/ physical activity      Says he only smokes when he drinks on the weekends.   Only drinks on the weekend. Only drinks beer. No other liquor or wine etc   Social Determinants of Health   Financial Resource Strain: Not on file  Food Insecurity: Not on file  Transportation Needs: Not on file  Physical Activity: Not on file  Stress: Not on file  Social Connections: Not on file  Intimate Partner Violence: Not on file    Physical Exam: Vital signs in last 24 hours: @BP  115/73   Pulse 96   Temp 98.2 F (36.8 C) (Temporal)   Ht 5\' 11"  (1.803 m)   Wt 172 lb (78 kg)   SpO2 97%   BMI 23.99 kg/m  GEN: NAD EYE: Sclerae anicteric ENT: MMM CV: Non-tachycardic Pulm: CTA b/l GI: Soft, NT/ND NEURO:  Alert & Oriented x 3   Doristine Locks, DO [facility]   10/08/2022 9:15 AM

## 2022-10-09 ENCOUNTER — Telehealth: Payer: Self-pay | Admitting: *Deleted

## 2022-10-09 NOTE — Telephone Encounter (Signed)
  Follow up Call-     10/08/2022    8:23 AM  Call back number  Post procedure Call Back phone  # [phone]  Permission to leave phone message Yes     Patient questions:  Do you have a fever, pain , or abdominal swelling? No. Pain Score  0 *  Have you tolerated food without any problems? Yes.    Have you been able to return to your normal activities? Yes.    Do you have any questions about your discharge instructions: Diet   No. Medications  No. Follow up visit  No.  Do you have questions or concerns about your Care? No.  Actions: * If pain score is 4 or above: No action needed, pain <4.

## 2023-03-06 ENCOUNTER — Other Ambulatory Visit: Payer: Self-pay

## 2023-03-06 ENCOUNTER — Ambulatory Visit: Payer: 59 | Admitting: Family Medicine

## 2023-03-06 ENCOUNTER — Encounter: Payer: Self-pay | Admitting: Family Medicine

## 2023-03-06 VITALS — BP 156/90 | HR 72 | Temp 98.6°F | Ht 71.0 in | Wt 183.0 lb

## 2023-03-06 DIAGNOSIS — I10 Essential (primary) hypertension: Secondary | ICD-10-CM | POA: Insufficient documentation

## 2023-03-06 DIAGNOSIS — R079 Chest pain, unspecified: Secondary | ICD-10-CM | POA: Diagnosis not present

## 2023-03-06 DIAGNOSIS — E785 Hyperlipidemia, unspecified: Secondary | ICD-10-CM | POA: Diagnosis not present

## 2023-03-06 MED ORDER — AMLODIPINE BESYLATE 5 MG PO TABS: 5.0000 mg | ORAL_TABLET | Freq: Every day | ORAL | 1 refills | Status: DC

## 2023-03-06 NOTE — Progress Notes (Signed)
Patient Office Visit  Assessment & Plan:  Primary hypertension -     CBC with Differential/Platelet -     Comprehensive metabolic panel -     amLODIPine Besylate; Take 1 tablet (5 mg total) by mouth daily.  Dispense: 90 tablet; Refill: 1  Chest pain, unspecified type -     EKG 12-Lead -     Ambulatory referral to Cardiology  Hyperlipidemia, unspecified hyperlipidemia type   Start amlodipine 5 mg once a day.  Check blood pressures at home.  Return in 1 month to see Dr. Tanya Nones.  Cardiology consult ordered at [facility].  EKG-normal sinus rhythm, no acute changes noted.  Patient made aware this. Return if symptoms worsen or fail to improve, for hypertension.   Subjective:    Patient ID: Kyle Lozano, male    DOB: 03/29/1960  Age: 63 y.o. MRN: [id number]  Chief Complaint  Patient presents with   Hypertension    Pt has been noticing changes in blood pressure over the last week.    New onset Hypertension Associated symptoms include chest pain.  Patient was feeling dizzy the past week and checked his blood pressure and systolics in 170 range.  Patient became nervous and wanted to be evaluated here.  No previous history of hypertension. Patient has had some chest pain at nighttime but not with exertion.  His job is very physical -does a lot of walking and lifting but has not had chest pain at work but only at night.  Patient thought it was related to blood pressure being up.  Patient is not having headaches visual disturbance or any peripheral swelling.  Family history is positive for heart disease in his dad but no history of hypertension or stroke.  Patient does smoke but mostly on the weekend and is trying to cut back. Chest pain-patient having left-sided chest pain under his axilla.  No associated symptoms of arm pain nausea vomiting diaphoresis.  Patient does not think it is due to lifting because he has not done anything different at work.  Patient was concerned about this since  this was a new symptom for him. Hyperlipidemia-patient does take cholesterol medication and is trying to eat healthy overall.  Patient is very active at work but not in a formal exercise program.  Patient does cook at home and eats healthy for the most part.   The ASCVD Risk score (Arnett DK, et al., 2019) failed to calculate for the following reasons:   Risk score cannot be calculated because patient has a medical history suggesting prior/existing ASCVD  Patient Active Problem List   Diagnosis Date Noted   Hypertension 03/06/2023   Chest pain 03/06/2023   HLD (hyperlipidemia)    Pain in left knee 11/11/2017   Erectile dysfunction 05/28/2017   Decreased libido 05/28/2017   Vitamin D deficiency 03/08/2015   Past Medical History:  Diagnosis Date   Allergy    seasonal w/ migraines   HLD (hyperlipidemia)    Stroke (HCC)    x2   Tobacco use    Family Status  Relation Name Status   Mother  Alive   Father  Deceased   Sister  Alive   Brother  Alive   Brother  Alive   Neg Hx  (Not Specified)  No partnership data on file      Review of Systems  Cardiovascular:  Positive for chest pain.  Neurological:  Positive for dizziness.      Objective:    BP Marland Kitchen)  156/90 (BP Location: Left Arm)   Pulse 72   Temp 98.6 F (37 C)   Ht 5\' 11"  (1.803 m)   Wt 183 lb (83 kg)   SpO2 98%   BMI 25.52 kg/m  BP Readings from Last 3 Encounters:  03/06/23 (!) 156/90  10/08/22 95/63  08/20/22 116/74   Wt Readings from Last 3 Encounters:  03/06/23 183 lb (83 kg)  10/08/22 172 lb (78 kg)  08/20/22 172 lb 8 oz (78.2 kg)    Physical Exam Vitals and nursing note reviewed.  Constitutional:      General: He is not in acute distress.    Appearance: Normal appearance.  HENT:     Head: Normocephalic.     Right Ear: Tympanic membrane, ear canal and external ear normal.     Left Ear: Tympanic membrane, ear canal and external ear normal.  Eyes:     Extraocular Movements: Extraocular movements  intact.     Conjunctiva/sclera: Conjunctivae normal.     Pupils: Pupils are equal, round, and reactive to light.  Cardiovascular:     Rate and Rhythm: Normal rate and regular rhythm.     Heart sounds: Normal heart sounds.  Pulmonary:     Effort: Pulmonary effort is normal.     Breath sounds: Normal breath sounds.  Chest:     Chest wall: No tenderness.  Musculoskeletal:     Right lower leg: No edema.     Left lower leg: No edema.  Neurological:     General: No focal deficit present.     Mental Status: He is alert and oriented to person, place, and time.  Psychiatric:        Mood and Affect: Mood normal.        Behavior: Behavior normal.        Thought Content: Thought content normal.        Judgment: Judgment normal.      No results found for any visits on 03/06/23.  Last lipids Lab Results  Component Value Date   CHOL 233 (H) 04/29/2022   HDL 69 04/29/2022   LDLCALC 145 (H) 04/29/2022   TRIG 84 04/29/2022   CHOLHDL 3.4 04/29/2022

## 2023-03-07 LAB — CBC WITH DIFFERENTIAL/PLATELET
Absolute Lymphocytes: 1573 {cells}/uL (ref 850–3900)
Absolute Monocytes: 608 {cells}/uL (ref 200–950)
Basophils Absolute: 38 {cells}/uL (ref 0–200)
Basophils Relative: 0.5 %
Eosinophils Absolute: 68 {cells}/uL (ref 15–500)
Eosinophils Relative: 0.9 %
HCT: 51.6 % — ABNORMAL HIGH (ref 38.5–50.0)
Hemoglobin: 17.8 g/dL — ABNORMAL HIGH (ref 13.2–17.1)
MCH: 31.8 pg (ref 27.0–33.0)
MCHC: 34.5 g/dL (ref 32.0–36.0)
MCV: 92.1 fL (ref 80.0–100.0)
MPV: 9.9 fL (ref 7.5–12.5)
Monocytes Relative: 8 %
Neutro Abs: 5312 {cells}/uL (ref 1500–7800)
Neutrophils Relative %: 69.9 %
Platelets: 191 10*3/uL (ref 140–400)
RBC: 5.6 10*6/uL (ref 4.20–5.80)
RDW: 12.4 % (ref 11.0–15.0)
Total Lymphocyte: 20.7 %
WBC: 7.6 10*3/uL (ref 3.8–10.8)

## 2023-03-07 LAB — COMPREHENSIVE METABOLIC PANEL
AG Ratio: 2.1 (calc) (ref 1.0–2.5)
ALT: 45 U/L (ref 9–46)
AST: 31 U/L (ref 10–35)
Albumin: 4.7 g/dL (ref 3.6–5.1)
Alkaline phosphatase (APISO): 70 U/L (ref 35–144)
BUN: 13 mg/dL (ref 7–25)
CO2: 25 mmol/L (ref 20–32)
Calcium: 9 mg/dL (ref 8.6–10.3)
Chloride: 106 mmol/L (ref 98–110)
Creat: 0.79 mg/dL (ref 0.70–1.35)
Globulin: 2.2 g/dL (ref 1.9–3.7)
Glucose, Bld: 122 mg/dL — ABNORMAL HIGH (ref 65–99)
Potassium: 4 mmol/L (ref 3.5–5.3)
Sodium: 141 mmol/L (ref 135–146)
Total Bilirubin: 0.8 mg/dL (ref 0.2–1.2)
Total Protein: 6.9 g/dL (ref 6.1–8.1)

## 2023-03-09 ENCOUNTER — Encounter: Payer: Self-pay | Admitting: Family Medicine

## 2023-04-25 ENCOUNTER — Other Ambulatory Visit: Payer: Self-pay | Admitting: Family Medicine

## 2023-04-27 NOTE — Telephone Encounter (Signed)
 Requested Prescriptions  Pending Prescriptions Disp Refills   rosuvastatin (CRESTOR) 20 MG tablet [Pharmacy Med Name: ROSUVASTATIN CALCIUM 20 MG TAB] 90 tablet 0    Sig: TAKE 1 TABLET BY MOUTH EVERY DAY     Cardiovascular:  Antilipid - Statins 2 Failed - 04/27/2023 12:22 PM      Failed - Valid encounter within last 12 months    Recent Outpatient Visits           2 years ago Cerebrovascular accident (CVA) due to occlusion of cerebral artery (HCC)   [facility] Pickard, Priscille Heidelberg, MD   2 years ago Vertigo   [facility] Valentino Nose, NP   2 years ago Screen for colon cancer   [facility] Pickard, Priscille Heidelberg, MD   2 years ago Pure hypercholesterolemia   [facility] Tanya Nones, Priscille Heidelberg, MD   3 years ago Tick bite of abdomen, initial encounter   [facility] Pickard, Priscille Heidelberg, MD       Future Appointments             In 4 days Pickard, Priscille Heidelberg, MD [facility]   In 2 weeks Tobb, Kardie, DO [facility]            Failed - Lipid Panel in normal range within the last 12 months    Cholesterol  Date Value Ref Range Status  04/29/2022 233 (H) <200 mg/dL Final   LDL Cholesterol (Calc)  Date Value Ref Range Status  04/29/2022 145 (H) mg/dL (calc) Final    Comment:    Reference range: <100 . Desirable range <100 mg/dL for primary prevention;   <70 mg/dL for patients with CHD or diabetic patients  with > or = 2 CHD risk factors. Marland Kitchen LDL-C is now calculated using the Martin-Hopkins  calculation, which is a validated novel method providing  better accuracy than the Friedewald equation in the  estimation of LDL-C.  Horald Pollen et al. Lenox Ahr. 1610;960(45): 2061-2068  (http://education.[employer].com/faq/FAQ164)    HDL  Date Value Ref Range Status  04/29/2022 69 > OR = 40 mg/dL Final   Triglycerides  Date Value Ref Range Status   04/29/2022 84 <150 mg/dL Final         Passed - Cr in normal range and within 360 days    Creat  Date Value Ref Range Status  03/06/2023 0.79 0.70 - 1.35 mg/dL Final         Passed - Patient is not pregnant

## 2023-05-01 ENCOUNTER — Encounter: Payer: Self-pay | Admitting: Family Medicine

## 2023-05-01 ENCOUNTER — Ambulatory Visit (INDEPENDENT_AMBULATORY_CARE_PROVIDER_SITE_OTHER): Payer: 59 | Admitting: Family Medicine

## 2023-05-01 VITALS — BP 142/78 | HR 68 | Temp 98.6°F | Ht 71.0 in | Wt 177.4 lb

## 2023-05-01 DIAGNOSIS — Z8673 Personal history of transient ischemic attack (TIA), and cerebral infarction without residual deficits: Secondary | ICD-10-CM | POA: Diagnosis not present

## 2023-05-01 DIAGNOSIS — Z125 Encounter for screening for malignant neoplasm of prostate: Secondary | ICD-10-CM

## 2023-05-01 DIAGNOSIS — E785 Hyperlipidemia, unspecified: Secondary | ICD-10-CM

## 2023-05-01 DIAGNOSIS — Z0001 Encounter for general adult medical examination with abnormal findings: Secondary | ICD-10-CM

## 2023-05-01 DIAGNOSIS — Z0000 Encounter for general adult medical examination without abnormal findings: Secondary | ICD-10-CM

## 2023-05-01 MED ORDER — HYDROCORTISONE 2.5 % EX CREA: TOPICAL_CREAM | Freq: Two times a day (BID) | CUTANEOUS | 1 refills | Status: AC

## 2023-05-01 NOTE — Progress Notes (Signed)
 Subjective:    Patient ID: Kyle Lozano, male    DOB: 1960/06/05, 63 y.o.   MRN: [id number]  HPI  Patient is here today for complete physical exam.  His last colonoscopy was in 2024.  They recommended a repeat colonoscopy in 2029.  He is due for a PSA today.  He has had the pneumonia vaccine.  He is due for the second dose of shingles vaccine.  His tetanus shot is not due again until 2027.  We discussed the flu, COVID, and RSV vaccines today.  He denies any chest pain or shortness of breath.  His blood pressure at home is consistently 120-130/70-80.  Past Medical History:  Diagnosis Date   Allergy    seasonal w/ migraines   HLD (hyperlipidemia)    Stroke (HCC)    x2   Tobacco use      Past Surgical History:  Procedure Laterality Date   COLONOSCOPY     POLYPECTOMY     WISDOM TOOTH EXTRACTION     Current Outpatient Medications on File Prior to Visit  Medication Sig Dispense Refill   amLODipine (NORVASC) 5 MG tablet Take 1 tablet (5 mg total) by mouth daily. 90 tablet 1   aspirin 81 MG tablet Take 81 mg by mouth daily.     Omega-3 Fatty Acids (FISH OIL) 1000 MG CAPS Take 1 capsule by mouth daily.     rosuvastatin (CRESTOR) 20 MG tablet TAKE 1 TABLET BY MOUTH EVERY DAY 90 tablet 0   vitamin E 180 MG (400 UNITS) capsule Take 400 Units by mouth daily.     No current facility-administered medications on file prior to visit.   No Known Allergies Social History   Socioeconomic History   Marital status: Married    Spouse name: Not on file   Number of children: Not on file   Years of education: Not on file   Highest education level: Not on file  Occupational History   Not on file  Tobacco Use   Smoking status: Some Days    Current packs/day: 0.50    Average packs/day: 0.5 packs/day for 44.2 years (22.1 ttl pk-yrs)    Types: Cigarettes    Start date: 02/18/1979   Smokeless tobacco: Never  Vaping Use   Vaping status: Never Used  Substance and Sexual Activity   Alcohol  use: Yes    Alcohol/week: 18.0 standard drinks of alcohol    Types: 18 Cans of beer per week   Drug use: No   Sexual activity: Not on file  Other Topics Concern   Not on file  Social History Narrative   He works doing Furniture conservator/restorer   Says his job involves a lot of moving around/ physical activity      Says he only smokes when he drinks on the weekends.   Only drinks on the weekend. Only drinks beer. No other liquor or wine etc   Social Drivers of Corporate investment banker Strain: Not on file  Food Insecurity: Not on file  Transportation Needs: Not on file  Physical Activity: Not on file  Stress: Not on file  Social Connections: Not on file  Intimate Partner Violence: Not on file   Family History  Problem Relation Age of Onset   Heart disease Father 33       MI @ 75. No other hx   Colon cancer Neg Hx    Colon polyps Neg Hx    Esophageal cancer Neg  Hx    Rectal cancer Neg Hx    Stomach cancer Neg Hx       Review of Systems  All other systems reviewed and are negative.      Objective:   Physical Exam Vitals reviewed.  Constitutional:      General: He is not in acute distress.    Appearance: Normal appearance. He is normal weight. He is not ill-appearing, toxic-appearing or diaphoretic.  HENT:     Head: Normocephalic and atraumatic.     Right Ear: Tympanic membrane and ear canal normal. There is no impacted cerumen.     Left Ear: Tympanic membrane and ear canal normal. There is no impacted cerumen.     Nose: Nose normal. No congestion or rhinorrhea.     Mouth/Throat:     Mouth: Mucous membranes are moist.     Pharynx: Oropharynx is clear. No oropharyngeal exudate or posterior oropharyngeal erythema.  Eyes:     General: No scleral icterus.       Right eye: No discharge.        Left eye: No discharge.     Extraocular Movements: Extraocular movements intact.     Conjunctiva/sclera: Conjunctivae normal.     Pupils: Pupils are equal, round, and  reactive to light.  Neck:     Vascular: No carotid bruit.  Cardiovascular:     Rate and Rhythm: Normal rate and regular rhythm.     Pulses: Normal pulses.     Heart sounds: Normal heart sounds. No murmur heard.    No friction rub. No gallop.  Pulmonary:     Effort: Pulmonary effort is normal. No respiratory distress.     Breath sounds: Normal breath sounds. No stridor. No wheezing, rhonchi or rales.  Chest:     Chest wall: No tenderness.  Abdominal:     General: Abdomen is flat. Bowel sounds are normal. There is no distension.     Palpations: Abdomen is soft. There is no mass.     Tenderness: There is no abdominal tenderness. There is no right CVA tenderness, left CVA tenderness, guarding or rebound.     Hernia: No hernia is present.  Musculoskeletal:        General: No swelling, tenderness, deformity or signs of injury. Normal range of motion.     Cervical back: Neck supple. No rigidity. No muscular tenderness.     Right lower leg: No edema.     Left lower leg: No edema.  Lymphadenopathy:     Cervical: No cervical adenopathy.  Skin:    General: Skin is warm.     Capillary Refill: Capillary refill takes less than 2 seconds.     Coloration: Skin is not jaundiced or pale.     Findings: No bruising, erythema, lesion or rash.  Neurological:     General: No focal deficit present.     Mental Status: He is alert and oriented to person, place, and time. Mental status is at baseline.     Cranial Nerves: No cranial nerve deficit.     Sensory: No sensory deficit.     Motor: No weakness.     Coordination: Coordination normal.     Gait: Gait normal.     Deep Tendon Reflexes: Reflexes normal.           Assessment & Plan:  Hyperlipidemia, unspecified hyperlipidemia type - Plan: CBC with Differential/Platelet, COMPLETE METABOLIC PANEL WITH GFR, Lipid panel  Prostate cancer screening - Plan: PSA  General medical exam  History of CVA (cerebrovascular accident) I am happy with his  blood pressure.  I will screen for prostate cancer with a PSA.  His colon cancer screening is up-to-date.  We discussed his vaccinations.  He will get the second dose of the shingles vaccine soon.  Check a CBC a CMP and a lipid panel.  Given his history of stroke, I would like his LDL cholesterol to be less than 55.

## 2023-05-02 LAB — COMPLETE METABOLIC PANEL WITH GFR
AG Ratio: 2.4 (calc) (ref 1.0–2.5)
ALT: 47 U/L — ABNORMAL HIGH (ref 9–46)
AST: 29 U/L (ref 10–35)
Albumin: 4.7 g/dL (ref 3.6–5.1)
Alkaline phosphatase (APISO): 65 U/L (ref 35–144)
BUN: 11 mg/dL (ref 7–25)
CO2: 27 mmol/L (ref 20–32)
Calcium: 9.1 mg/dL (ref 8.6–10.3)
Chloride: 104 mmol/L (ref 98–110)
Creat: 0.81 mg/dL (ref 0.70–1.35)
Globulin: 2 g/dL (ref 1.9–3.7)
Glucose, Bld: 139 mg/dL — ABNORMAL HIGH (ref 65–99)
Potassium: 3.9 mmol/L (ref 3.5–5.3)
Sodium: 140 mmol/L (ref 135–146)
Total Bilirubin: 1 mg/dL (ref 0.2–1.2)
Total Protein: 6.7 g/dL (ref 6.1–8.1)
eGFR: 99 mL/min/{1.73_m2} (ref >=60)

## 2023-05-06 LAB — CBC WITH DIFFERENTIAL/PLATELET
Absolute Lymphocytes: 1620 {cells}/uL (ref 850–3900)
Absolute Monocytes: 634 {cells}/uL (ref 200–950)
Basophils Absolute: 43 {cells}/uL (ref 0–200)
Basophils Relative: 0.6 %
Eosinophils Absolute: 144 {cells}/uL (ref 15–500)
Eosinophils Relative: 2 %
HCT: 47.4 % (ref 38.5–50.0)
Hemoglobin: 16.5 g/dL (ref 13.2–17.1)
MCH: 32 pg (ref 27.0–33.0)
MCHC: 34.8 g/dL (ref 32.0–36.0)
MCV: 92 fL (ref 80.0–100.0)
MPV: 9.5 fL (ref 7.5–12.5)
Monocytes Relative: 8.8 %
Neutro Abs: 4759 {cells}/uL (ref 1500–7800)
Neutrophils Relative %: 66.1 %
Platelets: 179 10*3/uL (ref 140–400)
RBC: 5.15 10*6/uL (ref 4.20–5.80)
RDW: 12.5 % (ref 11.0–15.0)
Total Lymphocyte: 22.5 %
WBC: 7.2 10*3/uL (ref 3.8–10.8)

## 2023-05-06 LAB — COMPREHENSIVE METABOLIC PANEL
AG Ratio: 2.4 (calc) (ref 1.0–2.5)
ALT: 47 U/L — ABNORMAL HIGH (ref 9–46)
AST: 29 U/L (ref 10–35)
Albumin: 4.7 g/dL (ref 3.6–5.1)
Alkaline phosphatase (APISO): 65 U/L (ref 35–144)
BUN: 11 mg/dL (ref 7–25)
CO2: 27 mmol/L (ref 20–32)
Calcium: 9.1 mg/dL (ref 8.6–10.3)
Chloride: 104 mmol/L (ref 98–110)
Creat: 0.81 mg/dL (ref 0.70–1.35)
Globulin: 2 g/dL (ref 1.9–3.7)
Glucose, Bld: 139 mg/dL — ABNORMAL HIGH (ref 65–99)
Potassium: 3.9 mmol/L (ref 3.5–5.3)
Sodium: 140 mmol/L (ref 135–146)
Total Bilirubin: 1 mg/dL (ref 0.2–1.2)
Total Protein: 6.7 g/dL (ref 6.1–8.1)
eGFR: 99 mL/min/{1.73_m2} (ref >=60)

## 2023-05-06 LAB — LIPID PANEL
Cholesterol: 141 mg/dL (ref <200)
HDL: 75 mg/dL (ref >=40)
LDL Cholesterol (Calc): 52 mg/dL
Non-HDL Cholesterol (Calc): 66 mg/dL (ref <130)
Total CHOL/HDL Ratio: 1.9 (calc) (ref <5.0)
Triglycerides: 68 mg/dL (ref <150)

## 2023-05-06 LAB — HEMOGLOBIN A1C
Hgb A1c MFr Bld: 6.1 %{Hb} — ABNORMAL HIGH (ref <5.7)
Mean Plasma Glucose: 128 mg/dL
eAG (mmol/L): 7.1 mmol/L

## 2023-05-06 LAB — PSA: PSA: 1.78 ng/mL (ref <=4.00)

## 2023-05-06 LAB — TEST AUTHORIZATION

## 2023-05-12 ENCOUNTER — Ambulatory Visit: Payer: 59 | Admitting: Cardiology

## 2023-07-27 ENCOUNTER — Other Ambulatory Visit: Payer: Self-pay

## 2023-07-27 ENCOUNTER — Telehealth: Payer: Self-pay

## 2023-07-27 MED ORDER — ROSUVASTATIN CALCIUM 20 MG PO TABS: 20.0000 mg | ORAL_TABLET | Freq: Every day | ORAL | 0 refills | Status: DC

## 2023-07-27 NOTE — Telephone Encounter (Signed)
 Prescription Request  07/27/2023  LOV: 05/01/23  What is the name of the medication or equipment? rosuvastatin  (CRESTOR ) 20 MG tablet [[id number]]   Have you contacted your pharmacy to request a refill? Yes   Which pharmacy would you like this sent to?  [id number] - [place], [place] - [address] [place] [address] Phone: [phone] Fax: [phone]    Patient notified that their request is being sent to the clinical staff for review and that they should receive a response within 2 business days.   Please advise at [facility] [phone]

## 2023-08-29 ENCOUNTER — Other Ambulatory Visit: Payer: Self-pay | Admitting: Family Medicine

## 2023-08-29 DIAGNOSIS — I10 Essential (primary) hypertension: Secondary | ICD-10-CM

## 2023-10-24 ENCOUNTER — Other Ambulatory Visit: Payer: Self-pay | Admitting: Family Medicine

## 2023-10-26 NOTE — Telephone Encounter (Signed)
 Requested Prescriptions  Pending Prescriptions Disp Refills   rosuvastatin  (CRESTOR ) 20 MG tablet [Pharmacy Med Name: ROSUVASTATIN  CALCIUM  20 MG TAB] 90 tablet 1    Sig: TAKE 1 TABLET BY MOUTH EVERY DAY     Cardiovascular:  Antilipid - Statins 2 Failed - 10/26/2023 11:05 AM      Failed - Lipid Panel in normal range within the last 12 months    Cholesterol  Date Value Ref Range Status  05/01/2023 141 <200 mg/dL Final   LDL Cholesterol (Calc)  Date Value Ref Range Status  05/01/2023 52 mg/dL (calc) Final    Comment:    Reference range: <100 . Desirable range <100 mg/dL for primary prevention;   <70 mg/dL for patients with CHD or diabetic patients  with > or = 2 CHD risk factors. SABRA LDL-C is now calculated using the Martin-Hopkins  calculation, which is a validated novel method providing  better accuracy than the Friedewald equation in the  estimation of LDL-C.  Gladis APPLETHWAITE et al. SANDREA. 7986;689(80): 2061-2068  (http://education.[facility].com/faq/FAQ164)    HDL  Date Value Ref Range Status  05/01/2023 75 > OR = 40 mg/dL Final   Triglycerides  Date Value Ref Range Status  05/01/2023 68 <150 mg/dL Final         Passed - Cr in normal range and within 360 days    Creat  Date Value Ref Range Status  05/01/2023 0.81 0.70 - 1.35 mg/dL Final  96/85/7974 9.18 0.70 - 1.35 mg/dL Final         Passed - Patient is not pregnant      Passed - Valid encounter within last 12 months    Recent Outpatient Visits           5 months ago Hyperlipidemia, unspecified hyperlipidemia type   [facility] Duanne Butler DASEN, MD   7 months ago Primary hypertension   [facility] Aletha Bene, MD   1 year ago Colon cancer screening   [place] [facility] Duanne Butler DASEN, MD   1 year ago Pure hypercholesterolemia   [place] [facility] Pick[place], Butler DASEN, MD

## 2024-02-28 ENCOUNTER — Other Ambulatory Visit: Payer: Self-pay | Admitting: Family Medicine

## 2024-02-28 DIAGNOSIS — I10 Essential (primary) hypertension: Secondary | ICD-10-CM
